# Patient Record
Sex: Female | Born: 1950 | Race: White | Hispanic: No | Marital: Married
Health system: Southern US, Community
[De-identification: ages and names within clinical notes are randomized; demographics above are authoritative.]

---

## 1998-06-03 ENCOUNTER — Other Ambulatory Visit: Admission: RE | Admit: 1998-06-03 | Discharge: 1998-06-03 | Payer: Self-pay | Admitting: Obstetrics and Gynecology

## 1998-08-11 ENCOUNTER — Other Ambulatory Visit: Admission: RE | Admit: 1998-08-11 | Discharge: 1998-08-11 | Payer: Self-pay | Admitting: Obstetrics and Gynecology

## 1998-08-11 ENCOUNTER — Encounter (INDEPENDENT_AMBULATORY_CARE_PROVIDER_SITE_OTHER): Payer: Self-pay | Admitting: *Deleted

## 1999-11-23 ENCOUNTER — Other Ambulatory Visit: Admission: RE | Admit: 1999-11-23 | Discharge: 1999-11-23 | Payer: Self-pay | Admitting: Obstetrics and Gynecology

## 2001-01-18 ENCOUNTER — Encounter: Payer: Self-pay | Admitting: Obstetrics and Gynecology

## 2001-01-18 ENCOUNTER — Ambulatory Visit (HOSPITAL_COMMUNITY): Admission: RE | Admit: 2001-01-18 | Discharge: 2001-01-18 | Payer: Self-pay | Admitting: Obstetrics and Gynecology

## 2001-03-27 ENCOUNTER — Other Ambulatory Visit: Admission: RE | Admit: 2001-03-27 | Discharge: 2001-03-27 | Payer: Self-pay | Admitting: Obstetrics and Gynecology

## 2002-01-29 ENCOUNTER — Encounter: Payer: Self-pay | Admitting: Obstetrics and Gynecology

## 2002-01-29 ENCOUNTER — Ambulatory Visit (HOSPITAL_COMMUNITY): Admission: RE | Admit: 2002-01-29 | Discharge: 2002-01-29 | Payer: Self-pay | Admitting: Obstetrics and Gynecology

## 2002-05-03 ENCOUNTER — Other Ambulatory Visit: Admission: RE | Admit: 2002-05-03 | Discharge: 2002-05-03 | Payer: Self-pay | Admitting: Obstetrics and Gynecology

## 2003-12-17 ENCOUNTER — Other Ambulatory Visit: Admission: RE | Admit: 2003-12-17 | Discharge: 2003-12-17 | Payer: Self-pay | Admitting: *Deleted

## 2004-01-30 ENCOUNTER — Ambulatory Visit (HOSPITAL_COMMUNITY): Admission: RE | Admit: 2004-01-30 | Discharge: 2004-01-30 | Payer: Self-pay | Admitting: Pulmonary Disease

## 2006-08-01 ENCOUNTER — Ambulatory Visit (HOSPITAL_COMMUNITY): Admission: RE | Admit: 2006-08-01 | Discharge: 2006-08-01 | Payer: Self-pay | Admitting: Cardiology

## 2010-02-21 ENCOUNTER — Encounter: Payer: Self-pay | Admitting: Obstetrics and Gynecology

## 2015-09-23 ENCOUNTER — Inpatient Hospital Stay (HOSPITAL_COMMUNITY): Payer: 59

## 2015-09-23 ENCOUNTER — Inpatient Hospital Stay (HOSPITAL_COMMUNITY)
Admission: AD | Admit: 2015-09-23 | Discharge: 2015-10-03 | DRG: 871 | Disposition: E | Payer: 59 | Source: Other Acute Inpatient Hospital | Attending: Pulmonary Disease | Admitting: Pulmonary Disease

## 2015-09-23 DIAGNOSIS — G4733 Obstructive sleep apnea (adult) (pediatric): Secondary | ICD-10-CM | POA: Diagnosis present

## 2015-09-23 DIAGNOSIS — N132 Hydronephrosis with renal and ureteral calculous obstruction: Secondary | ICD-10-CM

## 2015-09-23 DIAGNOSIS — Z66 Do not resuscitate: Secondary | ICD-10-CM | POA: Diagnosis not present

## 2015-09-23 DIAGNOSIS — E039 Hypothyroidism, unspecified: Secondary | ICD-10-CM | POA: Diagnosis present

## 2015-09-23 DIAGNOSIS — J9602 Acute respiratory failure with hypercapnia: Secondary | ICD-10-CM

## 2015-09-23 DIAGNOSIS — E669 Obesity, unspecified: Secondary | ICD-10-CM | POA: Diagnosis present

## 2015-09-23 DIAGNOSIS — A4151 Sepsis due to Escherichia coli [E. coli]: Principal | ICD-10-CM | POA: Diagnosis present

## 2015-09-23 DIAGNOSIS — E8770 Fluid overload, unspecified: Secondary | ICD-10-CM | POA: Diagnosis present

## 2015-09-23 DIAGNOSIS — Z6835 Body mass index (BMI) 35.0-35.9, adult: Secondary | ICD-10-CM

## 2015-09-23 DIAGNOSIS — Z87891 Personal history of nicotine dependence: Secondary | ICD-10-CM | POA: Diagnosis not present

## 2015-09-23 DIAGNOSIS — E872 Acidosis: Secondary | ICD-10-CM | POA: Diagnosis present

## 2015-09-23 DIAGNOSIS — I509 Heart failure, unspecified: Secondary | ICD-10-CM | POA: Diagnosis present

## 2015-09-23 DIAGNOSIS — K219 Gastro-esophageal reflux disease without esophagitis: Secondary | ICD-10-CM | POA: Diagnosis present

## 2015-09-23 DIAGNOSIS — J9600 Acute respiratory failure, unspecified whether with hypoxia or hypercapnia: Secondary | ICD-10-CM

## 2015-09-23 DIAGNOSIS — Z7982 Long term (current) use of aspirin: Secondary | ICD-10-CM | POA: Diagnosis not present

## 2015-09-23 DIAGNOSIS — Z87440 Personal history of urinary (tract) infections: Secondary | ICD-10-CM

## 2015-09-23 DIAGNOSIS — I4901 Ventricular fibrillation: Secondary | ICD-10-CM | POA: Diagnosis not present

## 2015-09-23 DIAGNOSIS — N179 Acute kidney failure, unspecified: Secondary | ICD-10-CM | POA: Diagnosis present

## 2015-09-23 DIAGNOSIS — E7800 Pure hypercholesterolemia, unspecified: Secondary | ICD-10-CM | POA: Diagnosis present

## 2015-09-23 DIAGNOSIS — Z7984 Long term (current) use of oral hypoglycemic drugs: Secondary | ICD-10-CM | POA: Diagnosis not present

## 2015-09-23 DIAGNOSIS — I252 Old myocardial infarction: Secondary | ICD-10-CM | POA: Diagnosis not present

## 2015-09-23 DIAGNOSIS — R0602 Shortness of breath: Secondary | ICD-10-CM

## 2015-09-23 DIAGNOSIS — Z882 Allergy status to sulfonamides status: Secondary | ICD-10-CM

## 2015-09-23 DIAGNOSIS — I469 Cardiac arrest, cause unspecified: Secondary | ICD-10-CM | POA: Diagnosis not present

## 2015-09-23 DIAGNOSIS — R579 Shock, unspecified: Secondary | ICD-10-CM | POA: Diagnosis not present

## 2015-09-23 DIAGNOSIS — I110 Hypertensive heart disease with heart failure: Secondary | ICD-10-CM | POA: Diagnosis present

## 2015-09-23 DIAGNOSIS — Z833 Family history of diabetes mellitus: Secondary | ICD-10-CM

## 2015-09-23 DIAGNOSIS — R6521 Severe sepsis with septic shock: Secondary | ICD-10-CM | POA: Diagnosis present

## 2015-09-23 DIAGNOSIS — I248 Other forms of acute ischemic heart disease: Secondary | ICD-10-CM | POA: Diagnosis not present

## 2015-09-23 DIAGNOSIS — E876 Hypokalemia: Secondary | ICD-10-CM | POA: Diagnosis present

## 2015-09-23 DIAGNOSIS — N136 Pyonephrosis: Secondary | ICD-10-CM | POA: Diagnosis present

## 2015-09-23 DIAGNOSIS — Z880 Allergy status to penicillin: Secondary | ICD-10-CM

## 2015-09-23 DIAGNOSIS — J9601 Acute respiratory failure with hypoxia: Secondary | ICD-10-CM | POA: Diagnosis not present

## 2015-09-23 DIAGNOSIS — E1165 Type 2 diabetes mellitus with hyperglycemia: Secondary | ICD-10-CM | POA: Diagnosis present

## 2015-09-23 DIAGNOSIS — A419 Sepsis, unspecified organism: Secondary | ICD-10-CM | POA: Diagnosis present

## 2015-09-23 LAB — GLUCOSE, CAPILLARY: GLUCOSE-CAPILLARY: 242 mg/dL — AB (ref 65–99)

## 2015-09-23 MED ORDER — FAMOTIDINE IN NACL 20-0.9 MG/50ML-% IV SOLN
20.0000 mg | Freq: Once | INTRAVENOUS | Status: AC
  Administered 2015-09-23: 20 mg via INTRAVENOUS
  Filled 2015-09-23: qty 50

## 2015-09-23 MED ORDER — PHENYLEPHRINE HCL 10 MG/ML IJ SOLN
25.0000 ug/min | INTRAMUSCULAR | Status: DC
  Administered 2015-09-23: 30 ug/min via INTRAVENOUS
  Administered 2015-09-24: 70 ug/min via INTRAVENOUS
  Administered 2015-09-24: 65 ug/min via INTRAVENOUS
  Administered 2015-09-24: 160 ug/min via INTRAVENOUS
  Administered 2015-09-24 (×2): 150 ug/min via INTRAVENOUS
  Administered 2015-09-24: 60 ug/min via INTRAVENOUS
  Administered 2015-09-24: 50 ug/min via INTRAVENOUS
  Administered 2015-09-24: 200 ug/min via INTRAVENOUS
  Filled 2015-09-23 (×10): qty 1

## 2015-09-23 MED ORDER — HEPARIN SODIUM (PORCINE) 5000 UNIT/ML IJ SOLN
5000.0000 [IU] | Freq: Three times a day (TID) | INTRAMUSCULAR | Status: DC
  Administered 2015-09-23 – 2015-09-24 (×2): 5000 [IU] via SUBCUTANEOUS
  Filled 2015-09-23 (×2): qty 1

## 2015-09-23 MED ORDER — SODIUM CHLORIDE 0.9 % IV SOLN
INTRAVENOUS | Status: DC
  Administered 2015-09-23 – 2015-09-24 (×2): via INTRAVENOUS

## 2015-09-23 MED ORDER — ASPIRIN 300 MG RE SUPP: 300.0000 mg | RECTAL | Status: DC

## 2015-09-23 MED ORDER — SODIUM CHLORIDE 0.9 % IV SOLN: 250.0000 mL | INTRAVENOUS | Status: DC | PRN

## 2015-09-23 MED ORDER — ASPIRIN 81 MG PO CHEW: 324.0000 mg | CHEWABLE_TABLET | ORAL | Status: DC

## 2015-09-23 NOTE — Progress Notes (Signed)
Pharmacy Antibiotic Note  Carrie Townsend is a 65 y.o. female admitted on 07/01/15 with pyelonephritis/urosepsis.  Pharmacy has been consulted for Levaquin and Gentamicin dosing.  Received Levaquin 750 and Gentamicin 80 mg at [facility] at ~3 pm  Plan: Levaquin 500 mg IV q48h F/U renal function this morning and redose if necessary  Height: 5\' 4"  (162.6 cm) Weight: 203 lb 11.3 oz (92.4 kg) IBW/kg (Calculated) : 54.7  No data recorded.  Labs: ( at [facility]) WBC  25.5 Hgb  11.1 Hct  33.1 Plt  296  SCr  2.23   No results for input(s): WBC, CREATININE, LATICACIDVEN, VANCOTROUGH, VANCOPEAK, VANCORANDOM, GENTTROUGH, GENTPEAK, GENTRANDOM, TOBRATROUGH, TOBRAPEAK, TOBRARND, AMIKACINPEAK, AMIKACINTROU, AMIKACIN in the last 168 hours.  CrCl cannot be calculated (No order found.).    Allergies  Allergen Reactions  . Penicillins Other (See Comments)    Unknown, Childhood.   Carrie Townsend. Sulfa Antibiotics Rash    Carrie Townsend, Carrie Townsend 07/01/15 11:44 PM

## 2015-09-23 NOTE — H&P (Signed)
PULMONARY / CRITICAL CARE MEDICINE   Name: Carrie Townsend MRN: [id number] DOB: January 10, 1951    ADMISSION DATE:  09-30-2015 CONSULTATION DATE:  09-30-2015  REFERRING MD:  [facility] Dr. Nechama GuardBauer  CHIEF COMPLAINT:  Shock/Pyelonephritis  HISTORY OF PRESENT ILLNESS:   65 year old female with PMH as below, which is significant for DM, HTN, hypothyroidism, and GERD. She was [facility] with diagnosis of UTI/pyelonephritis. KUB and Ultrasound demonstrate obstructing stone in R renal pelvis. She was hypotensive with lactic acid initially of 8 requiring 4L IVF resuscitation and subsequently vasoactive infusions.  [facility] was [facility] for further evaluation and urology consultation.   Patient has had recurrent UTIs since she was 65 years old. Her current urologist is Dr[facility] Ne[facility]. She's not sure why she has recurrent UTI but allegedly, workup has been unremarkable until this admission. Historically, she would get a UTI every month and will be placed on antibiotics. At certain times, she would be on an abx  for a year and that would control the UTI. Since December 2016, she has had UTI almost monthly. Prior to that, she was UTI free for 7 years at least. Since January of this year, she has been on antibiotics almost monthly. She has taken the following: Ciprofloxacin, levofloxacin, Bactroban. Over the weekend, she started having some fevers, chills, nausea, vomiting, abdominal pain, and right flank pain. She was admitted [facility] and was diagnosed to have a stone in her right kidney. She was then subsequently transferred here.    PAST MEDICAL HISTORY :  She  has no past medical history on file.  Her current UTI. Allegedly workup has been negative in the past. Hypertension, HTN, DM, hypothyroidism, GERD.  Denies CAD.  (-) History of cancer or blood clot. No history of asthma or COPD.   PAST SURGICAL HISTORY: She  has no past  surgical history on file.  (-) surgical history elicited.    Allergies not on file  She gets generalized rash and difficult to breathe when exposed to penicillin and sulfa drugs.   No current facility-administered medications on file prior to encounter.    No current outpatient prescriptions on file prior to encounter.    FAMILY HISTORY:  Her has no family status information on file.  Father is 214 years old. He has diabetes and HTN. Mother is deceased. She had heart dse.   SOCIAL HISTORY: She  is married. She currently works and does office work. She works for [employer]. She smoked in her 4720s for roughly 7 years, a pack would last maybe a week. Denies drinking alcohol.   REVIEW OF SYSTEMS:   Generalized weakness, fevers, chills, nausea, vomiting, anorexia, dyspnea, abdominal pain, right flank pain. She presented with all these symptoms and they're all somewhat better compared to this morning. 14 point review systems was in and everything else was negative.  SUBJECTIVE:   as mentioned.   VITAL SIGNS: There were no vitals taken for this visit.  HEMODYNAMICS: PAP: ()/()   VENTILATOR SETTINGS:    INTAKE / OUTPUT: No intake/output data recorded.  PHYSICAL EXAMINATION: General:  Awake, comfortable, not in distress. Neuro:  Cranial nerves grossly intact. No lateralizing signs elicited. HEENT:  PERLA, (-) NVD. Mallampati 3-4. (-) NVD. (-) oral thrush.  Cardiovascular:  Good S1 and S2, no S3 murmur rub or gallop. Lungs:  Good air entry, some crackles at the bases. No wheezing or rhonchi. No accessory muscle use.  Abdomen:  (+) BS, soft, obese, direct tenderness over at the R flank area/RUQ area. (-) rebound tenderness.  Musculoskeletal:  groslly normal. Good tone/bulk Skin:  Warm and dry. (-) edema/clubbing/cyanosis. (-) rash.   LABS:  BMET No results for input(s): NA, K, CL, CO2, BUN, CREATININE, GLUCOSE in the last 168 hours.  Electrolytes No results for  input(s): CALCIUM, MG, PHOS in the last 168 hours.  CBC No results for input(s): WBC, HGB, HCT, PLT in the last 168 hours.  Coag's No results for input(s): APTT, INR in the last 168 hours.  Sepsis Markers No results for input(s): LATICACIDVEN, PROCALCITON, O2SATVEN in the last 168 hours.  ABG No results for input(s): PHART, PCO2ART, PO2ART in the last 168 hours.  Liver Enzymes No results for input(s): AST, ALT, ALKPHOS, BILITOT, ALBUMIN in the last 168 hours.  Cardiac Enzymes No results for input(s): TROPONINI, PROBNP in the last 168 hours.  Glucose No results for input(s): GLUCAP in the last 168 hours.  Imaging No results found.   STUDIES:  KUB > 8mm calculus in R renal pelvis.  Abdominal US 8/22 > Obstructing 1 cm right UPJ calculus, gallbladder adenomyomatosis.   CULTURES: Blood 8/22> Urine 8/22 > Cultures from [facility] 8/22 >  ANTIBIOTICS: Levaquin 8/22 > Gentamycin 8/22 >  SIGNIFICANT EVENTS: 8/22 admit to [facility] pyelo > transfer [facility] septic shock  LINES/TUBES:   ASSESSMENT / PLAN:  PULMONARY A: At risk for pulmonary congestion/volume overload RVD 2/2 Obesity Likely with untreated OSA (with snoring, witnessed apneas, obese, crowded airway) P:   Keep O2 saturation more than 88%. Watch out for congestion. CXR now (baseline) We'll need a sleep study as an outpatient. May Need ABG if with note of SOB/dyspnea/hypersomnia  CARDIOVASCULAR A:  Septic shock, 2/2 UTI.  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 She is on Neo-Synephrine at 120 mcg/min and her MAP is > 65 mmHg. Try to wean off Neo-Synephrine. Check troponins. Check 2-D echo.  RENAL A:   AKI/Acute renal failure - pre-renal due to sepsis/shock.  Possible intrarenal as well secondary to obstructive uropathy with R UPJ calculus AG acidosis - lactic  P:   Cont IVF (maintenance). Has received 4.5 L since being  admitted this morning.  Check lytes.  Will need to call urology in am. Likely will need a stent/nephrostomy  GASTROINTESTINAL A:   GERD P:   NPO for now.  PPI.  Check amylase, lipase, LFTs  HEMATOLOGIC A:   No issues P:  Will observe.   INFECTIOUS A:   Severe Sepsis secondary to pyelonephritis / UTI 2/2 R UPJ calculus Recurrent UTI  P:   ABX as above Levophed has been weaned off. Wean off Neosynpehrine. Keep MAP > 65 mm Hg Follow cultures Obtain culture data from [facility]. > This year, patient has been on Ciprofloxacin in January, Levaquin in May and June, and [place] in July. We need to get cultures from Urologist office so we will be guided accordingly. Pt with PCN and sulfa allergy. She has not received aminoglycosides before. Not sure if she has received cephalosporins in the past. PCT Check lactate  ENDOCRINE A:   DM Hypothyroidism    P:   CBG monitoring and SSI Start home meds of syntroid.   NEUROLOGIC A:   Pain 2/2 Kidney stone P:   RASS goal: 0 Fentanyl prn   FAMILY  - Updates: Husband was updated at  bedside.   - Inter-disciplinary family meet or Palliative Care meeting due by:  09/30/15   Critical care time with this patient today : 30 minutes.   Pollie MeyerJ. Angelo A de Dios, MD 2015-04-21, 11:42 PM [facility] Pager [phone] After 3 pm or if no answer, call [phone]

## 2015-09-24 ENCOUNTER — Inpatient Hospital Stay (HOSPITAL_COMMUNITY): Payer: 59

## 2015-09-24 ENCOUNTER — Other Ambulatory Visit: Payer: Self-pay

## 2015-09-24 ENCOUNTER — Other Ambulatory Visit (HOSPITAL_COMMUNITY): Payer: 59

## 2015-09-24 ENCOUNTER — Encounter (HOSPITAL_COMMUNITY): Payer: Self-pay | Admitting: Interventional Radiology

## 2015-09-24 DIAGNOSIS — A419 Sepsis, unspecified organism: Secondary | ICD-10-CM

## 2015-09-24 DIAGNOSIS — R579 Shock, unspecified: Secondary | ICD-10-CM

## 2015-09-24 DIAGNOSIS — J9601 Acute respiratory failure with hypoxia: Secondary | ICD-10-CM

## 2015-09-24 DIAGNOSIS — N132 Hydronephrosis with renal and ureteral calculous obstruction: Secondary | ICD-10-CM

## 2015-09-24 DIAGNOSIS — R6521 Severe sepsis with septic shock: Secondary | ICD-10-CM

## 2015-09-24 DIAGNOSIS — J9600 Acute respiratory failure, unspecified whether with hypoxia or hypercapnia: Secondary | ICD-10-CM

## 2015-09-24 HISTORY — PX: IR GENERIC HISTORICAL: IMG1180011

## 2015-09-24 LAB — PROTIME-INR
INR: 1.35
INR: 1.41
INR: 1.62
PROTHROMBIN TIME: 16.8 s — AB (ref 11.4–15.2)
Prothrombin Time: 17.4 seconds — ABNORMAL HIGH (ref 11.4–15.2)
Prothrombin Time: 19.4 seconds — ABNORMAL HIGH (ref 11.4–15.2)

## 2015-09-24 LAB — BASIC METABOLIC PANEL
Anion gap: 14 (ref 5–15)
BUN: 33 mg/dL — AB (ref 6–20)
CALCIUM: 8 mg/dL — AB (ref 8.9–10.3)
CHLORIDE: 103 mmol/L (ref 101–111)
CO2: 17 mmol/L — ABNORMAL LOW (ref 22–32)
CREATININE: 2.58 mg/dL — AB (ref 0.44–1.00)
GFR calc Af Amer: 21 mL/min — ABNORMAL LOW (ref >60)
GFR, EST NON AFRICAN AMERICAN: 19 mL/min — AB (ref >60)
Glucose, Bld: 168 mg/dL — ABNORMAL HIGH (ref 65–99)
Potassium: 3.7 mmol/L (ref 3.5–5.1)
SODIUM: 134 mmol/L — AB (ref 135–145)

## 2015-09-24 LAB — POCT I-STAT 3, ART BLOOD GAS (G3+)
ACID-BASE DEFICIT: 19 mmol/L — AB (ref 0.0–2.0)
Acid-base deficit: 14 mmol/L — ABNORMAL HIGH (ref 0.0–2.0)
Acid-base deficit: 19 mmol/L — ABNORMAL HIGH (ref 0.0–2.0)
BICARBONATE: 12 meq/L — AB (ref 20.0–24.0)
BICARBONATE: 12.5 meq/L — AB (ref 20.0–24.0)
BICARBONATE: 14.7 meq/L — AB (ref 20.0–24.0)
O2 SAT: 89 %
O2 Saturation: 98 %
O2 Saturation: 97 %
PCO2 ART: 53.3 mmHg — AB (ref 35.0–45.0)
PCO2 ART: 49.5 mmHg — AB (ref 35.0–45.0)
PCO2 ART: 57.4 mmHg — AB (ref 35.0–45.0)
PH ART: 6.972 — AB (ref 7.350–7.450)
PH ART: 7.089 — AB (ref 7.350–7.450)
PO2 ART: 182 mmHg — AB (ref 80.0–100.0)
PO2 ART: 128 mmHg — AB (ref 80.0–100.0)
PO2 ART: 97 mmHg (ref 80.0–100.0)
Patient temperature: 101.3
Patient temperature: 101.3
Patient temperature: 101.3
TCO2: 14 mmol/L (ref 0–100)
TCO2: 14 mmol/L (ref 0–100)
TCO2: 16 mmol/L (ref 0–100)
pH, Arterial: 6.957 — CL (ref 7.350–7.450)

## 2015-09-24 LAB — CBC
HCT: 34 % — ABNORMAL LOW (ref 36.0–46.0)
Hemoglobin: 10.9 g/dL — ABNORMAL LOW (ref 12.0–15.0)
MCH: 28.8 pg (ref 26.0–34.0)
MCHC: 32.1 g/dL (ref 30.0–36.0)
MCV: 89.9 fL (ref 78.0–100.0)
PLATELETS: 254 10*3/uL (ref 150–400)
RBC: 3.78 MIL/uL — ABNORMAL LOW (ref 3.87–5.11)
RDW: 14.8 % (ref 11.5–15.5)
WBC: 14.4 10*3/uL — ABNORMAL HIGH (ref 4.0–10.5)

## 2015-09-24 LAB — COMPREHENSIVE METABOLIC PANEL
ALBUMIN: 2.6 g/dL — AB (ref 3.5–5.0)
ALK PHOS: 60 U/L (ref 38–126)
ALT: 15 U/L (ref 14–54)
ALT: 15 U/L (ref 14–54)
ANION GAP: 11 (ref 5–15)
AST: 26 U/L (ref 15–41)
AST: 27 U/L (ref 15–41)
Albumin: 2.1 g/dL — ABNORMAL LOW (ref 3.5–5.0)
Alkaline Phosphatase: 300 U/L — ABNORMAL HIGH (ref 38–126)
Anion gap: 15 (ref 5–15)
BUN: 29 mg/dL — ABNORMAL HIGH (ref 6–20)
BUN: 33 mg/dL — ABNORMAL HIGH (ref 6–20)
CALCIUM: 7.8 mg/dL — AB (ref 8.9–10.3)
CHLORIDE: 106 mmol/L (ref 101–111)
CHLORIDE: 103 mmol/L (ref 101–111)
CO2: 16 mmol/L — AB (ref 22–32)
CO2: 14 mmol/L — AB (ref 22–32)
CREATININE: 2.67 mg/dL — AB (ref 0.44–1.00)
Calcium: 7.4 mg/dL — ABNORMAL LOW (ref 8.9–10.3)
Creatinine, Ser: 2.76 mg/dL — ABNORMAL HIGH (ref 0.44–1.00)
GFR calc non Af Amer: 17 mL/min — ABNORMAL LOW (ref >60)
GFR calc non Af Amer: 18 mL/min — ABNORMAL LOW (ref >60)
GFR, EST AFRICAN AMERICAN: 20 mL/min — AB (ref >60)
GFR, EST AFRICAN AMERICAN: 21 mL/min — AB (ref >60)
GLUCOSE: 234 mg/dL — AB (ref 65–99)
Glucose, Bld: 189 mg/dL — ABNORMAL HIGH (ref 65–99)
POTASSIUM: 3.2 mmol/L — AB (ref 3.5–5.1)
Potassium: 3.7 mmol/L (ref 3.5–5.1)
SODIUM: 133 mmol/L — AB (ref 135–145)
SODIUM: 132 mmol/L — AB (ref 135–145)
Total Bilirubin: 0.9 mg/dL (ref 0.3–1.2)
Total Bilirubin: 1.3 mg/dL — ABNORMAL HIGH (ref 0.3–1.2)
Total Protein: 5.5 g/dL — ABNORMAL LOW (ref 6.5–8.1)
Total Protein: 5.8 g/dL — ABNORMAL LOW (ref 6.5–8.1)

## 2015-09-24 LAB — GLUCOSE, CAPILLARY
GLUCOSE-CAPILLARY: 191 mg/dL — AB (ref 65–99)
GLUCOSE-CAPILLARY: 203 mg/dL — AB (ref 65–99)
Glucose-Capillary: 184 mg/dL — ABNORMAL HIGH (ref 65–99)
Glucose-Capillary: 168 mg/dL — ABNORMAL HIGH (ref 65–99)
Glucose-Capillary: 163 mg/dL — ABNORMAL HIGH (ref 65–99)
Glucose-Capillary: 129 mg/dL — ABNORMAL HIGH (ref 65–99)

## 2015-09-24 LAB — CBC WITH DIFFERENTIAL/PLATELET
BASOS PCT: 0 %
BASOS PCT: 0 %
Basophils Absolute: 0 10*3/uL (ref 0.0–0.1)
Basophils Absolute: 0 10*3/uL (ref 0.0–0.1)
EOS ABS: 0 10*3/uL (ref 0.0–0.7)
EOS ABS: 0 10*3/uL (ref 0.0–0.7)
EOS PCT: 1 %
Eosinophils Relative: 0 %
HCT: 35.4 % — ABNORMAL LOW (ref 36.0–46.0)
HCT: 36.2 % (ref 36.0–46.0)
HEMOGLOBIN: 11.3 g/dL — AB (ref 12.0–15.0)
Hemoglobin: 11.5 g/dL — ABNORMAL LOW (ref 12.0–15.0)
LYMPHS PCT: 16 %
LYMPHS PCT: 4 %
Lymphs Abs: 0.7 10*3/uL (ref 0.7–4.0)
Lymphs Abs: 0.9 10*3/uL (ref 0.7–4.0)
MCH: 28.9 pg (ref 26.0–34.0)
MCH: 29 pg (ref 26.0–34.0)
MCHC: 31.8 g/dL (ref 30.0–36.0)
MCHC: 31.9 g/dL (ref 30.0–36.0)
MCV: 90.5 fL (ref 78.0–100.0)
MCV: 91.2 fL (ref 78.0–100.0)
MONOS PCT: 2 %
Monocytes Absolute: 0.1 10*3/uL (ref 0.1–1.0)
Monocytes Absolute: 0.9 10*3/uL (ref 0.1–1.0)
Monocytes Relative: 4 %
NEUTROS ABS: 20.2 10*3/uL — AB (ref 1.7–7.7)
NEUTROS PCT: 81 %
Neutro Abs: 3.3 10*3/uL (ref 1.7–7.7)
Neutrophils Relative %: 92 %
PLATELETS: 202 10*3/uL (ref 150–400)
Platelets: 288 10*3/uL (ref 150–400)
RBC: 3.91 MIL/uL (ref 3.87–5.11)
RBC: 3.97 MIL/uL (ref 3.87–5.11)
RDW: 14.8 % (ref 11.5–15.5)
RDW: 15 % (ref 11.5–15.5)
WBC MORPHOLOGY: INCREASED
WBC: 22 10*3/uL — ABNORMAL HIGH (ref 4.0–10.5)
WBC: 4.1 10*3/uL (ref 4.0–10.5)

## 2015-09-24 LAB — LACTIC ACID, PLASMA
LACTIC ACID, VENOUS: 4.2 mmol/L — AB (ref 0.5–1.9)
LACTIC ACID, VENOUS: 5.9 mmol/L — AB (ref 0.5–1.9)
Lactic Acid, Venous: 7.3 mmol/L (ref 0.5–1.9)

## 2015-09-24 LAB — MAGNESIUM
MAGNESIUM: 2.7 mg/dL — AB (ref 1.7–2.4)
Magnesium: 1.1 mg/dL — ABNORMAL LOW (ref 1.7–2.4)

## 2015-09-24 LAB — CORTISOL: Cortisol, Plasma: 27.4 ug/dL

## 2015-09-24 LAB — TROPONIN I
TROPONIN I: 1.83 ng/mL — AB (ref <0.03)
TROPONIN I: 2.03 ng/mL — AB (ref <0.03)
TROPONIN I: 2.15 ng/mL — AB (ref <0.03)
Troponin I: 1.78 ng/mL (ref <0.03)

## 2015-09-24 LAB — LIPASE, BLOOD: Lipase: 20 U/L (ref 11–51)

## 2015-09-24 LAB — APTT: APTT: 47 s — AB (ref 24–36)

## 2015-09-24 LAB — FIBRINOGEN: FIBRINOGEN: 788 mg/dL — AB (ref 210–475)

## 2015-09-24 LAB — PHOSPHORUS
PHOSPHORUS: 3.4 mg/dL (ref 2.5–4.6)
Phosphorus: 3.8 mg/dL (ref 2.5–4.6)

## 2015-09-24 LAB — PROCALCITONIN: PROCALCITONIN: 48.41 ng/mL

## 2015-09-24 LAB — AMYLASE: AMYLASE: 27 U/L — AB (ref 28–100)

## 2015-09-24 LAB — MRSA PCR SCREENING: MRSA by PCR: NEGATIVE

## 2015-09-24 MED ORDER — MIDAZOLAM HCL 2 MG/2ML IJ SOLN
INTRAMUSCULAR | Status: AC
  Filled 2015-09-24: qty 4

## 2015-09-24 MED ORDER — PANTOPRAZOLE SODIUM 40 MG PO PACK
40.0000 mg | PACK | Freq: Every day | ORAL | Status: DC
  Administered 2015-09-24: 40 mg
  Filled 2015-09-24: qty 20

## 2015-09-24 MED ORDER — MIDAZOLAM HCL 2 MG/2ML IJ SOLN
INTRAMUSCULAR | Status: AC | PRN
  Administered 2015-09-24 (×3): 1 mg via INTRAVENOUS

## 2015-09-24 MED ORDER — FENTANYL CITRATE (PF) 100 MCG/2ML IJ SOLN
INTRAMUSCULAR | Status: AC | PRN
  Administered 2015-09-24: 25 ug via INTRAVENOUS
  Administered 2015-09-24: 50 ug via INTRAVENOUS
  Administered 2015-09-24: 25 ug via INTRAVENOUS
  Administered 2015-09-24: 50 ug via INTRAVENOUS

## 2015-09-24 MED ORDER — INSULIN ASPART 100 UNIT/ML ~~LOC~~ SOLN
0.0000 [IU] | SUBCUTANEOUS | Status: DC
  Administered 2015-09-24: 5 [IU] via SUBCUTANEOUS
  Administered 2015-09-24 (×4): 3 [IU] via SUBCUTANEOUS

## 2015-09-24 MED ORDER — SODIUM CHLORIDE 0.9% FLUSH: 10.0000 mL | INTRAVENOUS | Status: DC | PRN

## 2015-09-24 MED ORDER — SODIUM BICARBONATE 8.4 % IV SOLN: 100.0000 meq | Freq: Once | INTRAVENOUS | Status: DC

## 2015-09-24 MED ORDER — VANCOMYCIN HCL 10 G IV SOLR
1750.0000 mg | Freq: Once | INTRAVENOUS | Status: AC
  Administered 2015-09-24: 1750 mg via INTRAVENOUS
  Filled 2015-09-24: qty 1750

## 2015-09-24 MED ORDER — LIDOCAINE HCL 1 % IJ SOLN
INTRAMUSCULAR | Status: AC
  Filled 2015-09-24: qty 20

## 2015-09-24 MED ORDER — SODIUM CHLORIDE 0.9 % IV SOLN
500.0000 mg | Freq: Two times a day (BID) | INTRAVENOUS | Status: DC
  Administered 2015-09-24 (×2): 500 mg via INTRAVENOUS
  Filled 2015-09-24 (×3): qty 500

## 2015-09-24 MED ORDER — PHENYLEPHRINE HCL 10 MG/ML IJ SOLN
25.0000 ug/min | INTRAVENOUS | Status: DC
  Administered 2015-09-24: 160 ug/min via INTRAVENOUS
  Filled 2015-09-24 (×2): qty 4

## 2015-09-24 MED ORDER — MAGNESIUM SULFATE 4 GM/100ML IV SOLN
4.0000 g | Freq: Once | INTRAVENOUS | Status: AC
  Administered 2015-09-24: 4 g via INTRAVENOUS
  Filled 2015-09-24: qty 100

## 2015-09-24 MED ORDER — FENTANYL CITRATE (PF) 100 MCG/2ML IJ SOLN: 25.0000 ug | INTRAMUSCULAR | Status: DC | PRN

## 2015-09-24 MED ORDER — ACETAMINOPHEN 325 MG PO TABS
650.0000 mg | ORAL_TABLET | Freq: Four times a day (QID) | ORAL | Status: DC | PRN
  Administered 2015-09-24: 650 mg via ORAL
  Filled 2015-09-24: qty 2

## 2015-09-24 MED ORDER — HEPARIN SODIUM (PORCINE) 5000 UNIT/ML IJ SOLN
5000.0000 [IU] | Freq: Three times a day (TID) | INTRAMUSCULAR | Status: DC
  Filled 2015-09-24: qty 1

## 2015-09-24 MED ORDER — NOREPINEPHRINE BITARTRATE 1 MG/ML IV SOLN
0.0000 ug/min | INTRAVENOUS | Status: DC
  Administered 2015-09-24: 10 ug/min via INTRAVENOUS

## 2015-09-24 MED ORDER — SODIUM BICARBONATE 8.4 % IV SOLN
100.0000 meq | Freq: Once | INTRAVENOUS | Status: AC
  Administered 2015-09-24: 100 meq via INTRAVENOUS
  Filled 2015-09-24: qty 50

## 2015-09-24 MED ORDER — SODIUM BICARBONATE 8.4 % IV SOLN
INTRAVENOUS | Status: AC
Start: 2015-09-24 — End: 2015-09-24
  Filled 2015-09-24: qty 50

## 2015-09-24 MED ORDER — FENTANYL CITRATE (PF) 100 MCG/2ML IJ SOLN
INTRAMUSCULAR | Status: AC
  Filled 2015-09-24: qty 4

## 2015-09-24 MED ORDER — LEVOFLOXACIN IN D5W 500 MG/100ML IV SOLN: 500.0000 mg | INTRAVENOUS | Status: DC

## 2015-09-24 MED ORDER — ETOMIDATE 2 MG/ML IV SOLN
20.0000 mg | Freq: Once | INTRAVENOUS | Status: AC
  Administered 2015-09-24: 20 mg via INTRAVENOUS

## 2015-09-24 MED ORDER — FENTANYL CITRATE (PF) 100 MCG/2ML IJ SOLN
100.0000 ug | Freq: Once | INTRAMUSCULAR | Status: AC
  Administered 2015-09-24: 100 ug via INTRAVENOUS

## 2015-09-24 MED ORDER — VANCOMYCIN HCL IN DEXTROSE 1-5 GM/200ML-% IV SOLN: 1000.0000 mg | INTRAVENOUS | Status: DC

## 2015-09-24 MED ORDER — SODIUM BICARBONATE 8.4 % IV SOLN
INTRAVENOUS | Status: AC
  Filled 2015-09-24: qty 200

## 2015-09-24 MED ORDER — ROCURONIUM BROMIDE 50 MG/5ML IV SOLN
50.0000 mg | Freq: Once | INTRAVENOUS | Status: AC
  Administered 2015-09-24: 50 mg via INTRAVENOUS

## 2015-09-24 MED ORDER — FENTANYL CITRATE (PF) 100 MCG/2ML IJ SOLN
25.0000 ug | INTRAMUSCULAR | Status: DC | PRN
  Administered 2015-09-24: 100 ug via INTRAVENOUS
  Filled 2015-09-24 (×3): qty 2

## 2015-09-24 MED ORDER — MIDAZOLAM HCL 2 MG/2ML IJ SOLN
INTRAMUSCULAR | Status: AC
  Administered 2015-09-24: 2 mg
  Filled 2015-09-24: qty 2

## 2015-09-24 MED ORDER — ONDANSETRON HCL 4 MG/2ML IJ SOLN
4.0000 mg | INTRAMUSCULAR | Status: AC
  Administered 2015-09-24: 4 mg via INTRAVENOUS

## 2015-09-24 MED ORDER — STERILE WATER FOR INJECTION IV SOLN
INTRAVENOUS | Status: DC
  Administered 2015-09-24: 22:00:00 via INTRAVENOUS
  Filled 2015-09-24 (×4): qty 850

## 2015-09-24 MED ORDER — HYDROCORTISONE NA SUCCINATE PF 100 MG IJ SOLR: 50.0000 mg | Freq: Four times a day (QID) | INTRAMUSCULAR | Status: DC

## 2015-09-24 MED ORDER — MIDAZOLAM HCL 2 MG/2ML IJ SOLN: 2.0000 mg | Freq: Once | INTRAMUSCULAR | Status: AC

## 2015-09-24 MED ORDER — IOPAMIDOL (ISOVUE-300) INJECTION 61%
INTRAVENOUS | Status: AC
  Administered 2015-09-24: 20 mL
  Filled 2015-09-24: qty 50

## 2015-09-24 MED ORDER — POTASSIUM CHLORIDE 10 MEQ/50ML IV SOLN
10.0000 meq | INTRAVENOUS | Status: AC
  Administered 2015-09-24 (×2): 10 meq via INTRAVENOUS
  Filled 2015-09-24: qty 50

## 2015-09-24 MED ORDER — SODIUM CHLORIDE 0.9% FLUSH: 10.0000 mL | Freq: Two times a day (BID) | INTRAVENOUS | Status: DC

## 2015-09-24 MED ORDER — ASPIRIN 325 MG PO TABS
325.0000 mg | ORAL_TABLET | Freq: Every day | ORAL | Status: DC
  Administered 2015-09-24: 325 mg via ORAL
  Filled 2015-09-24: qty 1

## 2015-09-24 MED ORDER — MIDAZOLAM HCL 2 MG/2ML IJ SOLN: 1.0000 mg | INTRAMUSCULAR | Status: DC | PRN

## 2015-09-25 MED FILL — Medication: Qty: 1 | Status: AC

## 2015-09-25 NOTE — Procedures (Signed)
Extubation Procedure Note  Patient Details:   Name: Carrie Townsend DOB: [name] MRN: [id number]   Airway Documentation:     Evaluation  O2 sats: unreadable, pt has deceased Complications: No apparent complications Patient did tolerate procedure well. Bilateral Breath Sounds: Clear, Diminished   No  Pt has been taking off the vent, pt has deceased. Pt was extubated per family request immediately after death. RN aware.   Benjamine SpragueMaurice L Castin Donaghue, BS, RRT, RCP 09/25/2015, 12:02 AM

## 2015-09-26 LAB — TYPE AND SCREEN
ABO/RH(D): A NEG
Antibody Screen: POSITIVE
DAT, IGG: NEGATIVE
UNIT DIVISION: 0
UNIT DIVISION: 0

## 2015-09-26 LAB — URINE CULTURE: Culture: >=100000 — AB

## 2015-09-27 LAB — URINE CULTURE

## 2015-09-28 NOTE — Discharge Summary (Signed)
PULMONARY / CRITICAL CARE MEDICINE   Name: JAMIE HAFFORD MRN: [id number] DOB: [birth year]    ADMISSION DATE:  09/09/2015 CONSULTATION DATE:  09/30/2015  REFERRING MD:  Maryruth Bun Dr. Nechama Guard  CHIEF COMPLAINT:  Shock/Pyelonephritis  HISTORY OF PRESENT ILLNESS:   65 year old female with PMH as below, which is significant for DM, HTN, hypothyroidism, and GERD. She was admitted to [facility] with diagnosis of UTI/pyelonephritis. KUB and Ultrasound demonstrate obstructing stone in R renal pelvis. She was hypotensive with lactic acid initially of 8 requiring 4L IVF resuscitation and subsequently vasoactive infusions.  She was transferred to [facility] for further evaluation and urology consultation.   Patient has had recurrent UTIs since she was 65 years old. Her current urologist is Dr. Nechama Guard over at [facility]. She's not sure why she has recurrent UTI but allegedly, workup has been unremarkable until this admission. Historically, she would get a UTI every month and will be placed on antibiotics. At certain times, she would be on an abx  for a year and that would control the UTI. Since December 2016, she has had UTI almost monthly. Prior to that, she was UTI free for 7 years at least. Since January of this year, she has been on antibiotics almost monthly. She has taken the following: Ciprofloxacin, levofloxacin, Bactroban. Over the weekend, she started having some fevers, chills, nausea, vomiting, abdominal pain, and right flank pain. She was admitted at [facility] and was diagnosed to have a stone in her right kidney. She was then subsequently transferred here.    PAST MEDICAL HISTORY :  She  has no past medical history on file.  Her current UTI. Allegedly workup has been negative in the past. Hypertension, HTN, DM, hypothyroidism, GERD.  Denies CAD.  (-) History of cancer or blood clot. No history of asthma or COPD.   PAST SURGICAL HISTORY: She  has a past surgical  history that includes ir generic historical (09/29/15).  (-) surgical history elicited.    Allergies  Allergen Reactions  . Penicillins Other (See Comments)    Has patient had a PCN reaction causing immediate rash, facial/tongue/throat swelling, SOB or lightheadedness with hypotension: No Has patient had a PCN reaction causing severe rash involving mucus membranes or skin necrosis: No Has patient had a PCN reaction that required hospitalization No Has patient had a PCN reaction occurring within the last 10 years: No If all of the above answers are "NO", then may proceed with Cephalosporin use.  Unknown, Childhood. Pt thinks that it may have been a rash.   . Sulfa Antibiotics Rash    She gets generalized rash and difficult to breathe when exposed to penicillin and sulfa drugs.   No current facility-administered medications on file prior to encounter.    No current outpatient prescriptions on file prior to encounter.    FAMILY HISTORY:  Her has no family status information on file.  Father is 7 years old. He has diabetes and HTN. Mother is deceased. She had heart dse.   SOCIAL HISTORY: She  is married. 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 She smoked in her 22s for roughly 7 years, a pack would last maybe a week. Denies drinking alcohol.   REVIEW OF SYSTEMS:   Generalized weakness, fevers, chills, nausea, vomiting, anorexia, dyspnea, abdominal pain, right flank pain. She presented with all these symptoms and they're all somewhat better compared to this morning. 14 point review systems was in and  everything else was negative.  SUBJECTIVE:   as mentioned.   VITAL SIGNS: BP (!) 55/31   Pulse (!) 117   Temp (!) 101.3 F (38.5 C) (Oral)   Resp (!) 30   Ht 5\' 4"  (1.626 m)   Wt 204 lb 2.3 oz (92.6 kg)   SpO2 98%   BMI 35.04 kg/m   HEMODYNAMICS:    VENTILATOR SETTINGS:    INTAKE / OUTPUT: I/O last 3 completed shifts: In: 1481.9  [I.V.:901.9; NG/GT:30; IV Piggyback:550] Out: 600 [Urine:600]  PHYSICAL EXAMINATION: General:  Awake, comfortable, not in distress. Neuro:  Cranial nerves grossly intact. No lateralizing signs elicited. HEENT:  PERLA, (-) NVD. Mallampati 3-4. (-) NVD. (-) oral thrush.  Cardiovascular:  Good S1 and S2, no S3 murmur rub or gallop. Lungs:  Good air entry, some crackles at the bases. No wheezing or rhonchi. No accessory muscle use. Abdomen:  (+) BS, soft, obese, direct tenderness over at the R flank area/RUQ area. (-) rebound tenderness.  Musculoskeletal:  groslly normal. Good tone/bulk Skin:  Warm and dry. (-) edema/clubbing/cyanosis. (-) rash.   LABS:  BMET  Recent Labs Lab September 30, 2015 2355 10/02/2015 0521 09/22/2015 1702  NA 132* 134* 133*  K 3.7 3.7 3.2*  CL 103 103 106  CO2 14* 17* 16*  BUN 29* 33* 33*  CREATININE 2.67* 2.58* 2.76*  GLUCOSE 234* 168* 189*    Electrolytes  Recent Labs Lab 30-Sep-2015 2355 09/29/2015 0521 09/27/2015 1702  CALCIUM 7.8* 8.0* 7.4*  MG 1.1* 2.7*  --   PHOS 3.4 3.8  --     CBC  Recent Labs Lab September 30, 2015 2359 09/20/2015 0521 09/14/2015 1702  WBC 22.0* 14.4* 4.1  HGB 11.3* 10.9* 11.5*  HCT 35.4* 34.0* 36.2  PLT 288 254 202    Coag's  Recent Labs Lab Sep 30, 2015 2355 09/27/2015 1318 09/23/2015 2024  APTT  --   --  47*  INR 1.41 1.35 1.62    Sepsis Markers  Recent Labs Lab 2015-09-30 2355 Sep 30, 2015 2356 10/02/2015 0259 09/05/2015 1702  LATICACIDVEN  --  7.3* 4.2* 5.9*  PROCALCITON 48.41  --   --   --     ABG  Recent Labs Lab 09/28/2015 2110 09/11/2015 2244 09/11/2015 2328  PHART 6.972* 7.089* 6.957*  PCO2ART 53.3* 49.5* 57.4*  PO2ART 182.0* 128.0* 97.0    Liver Enzymes  Recent Labs Lab Sep 30, 2015 2355 09/23/2015 1702  AST 27 26  ALT 15 15  ALKPHOS 60 300*  BILITOT 0.9 1.3*  ALBUMIN 2.6* 2.1*    Cardiac Enzymes  Recent Labs Lab 09/13/2015 0521 09/21/2015 1027 09/02/2015 1702  TROPONINI 2.15* 2.03* 1.78*    Glucose  Recent  Labs Lab 09/26/2015 0051 09/28/2015 0436 09/18/2015 0713 09/27/2015 1140 09/17/2015 1658 09/13/2015 2010  GLUCAP 203* 184* 168* 163* 129* 191*    Imaging No results found.   STUDIES:  KUB > 8mm calculus in R renal pelvis.  Abdominal US 8/22 > Obstructing 1 cm right UPJ calculus, gallbladder adenomyomatosis.   CULTURES: Blood 8/22> Urine 8/22 > Cultures from [facility] 8/22 >  ANTIBIOTICS: Levaquin 8/22 > Gentamycin 8/22 >  SIGNIFICANT EVENTS: 8/22 admit to [facility] for pyelo > transfer to [facility] septic shock  LINES/TUBES:   ASSESSMENT / PLAN:  PULMONARY A: At risk for pulmonary congestion/volume overload RVD 2/2 Obesity Likely with untreated OSA (with snoring, witnessed apneas, obese, crowded airway) P:   Keep O2 saturation more than 88%. Watch out for congestion. CXR now (baseline) We'll need a sleep study as  an outpatient. May Need ABG if with note of SOB/dyspnea/hypersomnia  CARDIOVASCULAR A:  Septic shock, 2/2 UTI.  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 She is on Neo-Synephrine at 120 mcg/min and her MAP is > 65 mmHg. Try to wean off Neo-Synephrine. Check troponins. Check 2-D echo.  RENAL A:   AKI/Acute renal failure - pre-renal due to sepsis/shock.  Possible intrarenal as well secondary to obstructive uropathy with R UPJ calculus AG acidosis - lactic  P:   Cont IVF (maintenance). Has received 4.5 L since being admitted this morning.  Check lytes.  Will need to call urology in am. Likely will need a stent/nephrostomy  GASTROINTESTINAL A:   GERD P:   NPO for now.  PPI.  Check amylase, lipase, LFTs  HEMATOLOGIC A:   No issues P:  Will observe.   INFECTIOUS A:   Severe Sepsis secondary to pyelonephritis / UTI 2/2 R UPJ calculus Recurrent UTI  P:   ABX as above Levophed has been weaned off. Wean off Neosynpehrine. Keep MAP > 65 mm Hg Follow cultures Obtain culture data from  [facility]. > This year, patient has been on Ciprofloxacin in January, Levaquin in May and June, and [place] in July. We need to get cultures from Urologist office so we will be guided accordingly. Pt with PCN and sulfa allergy. She has not received aminoglycosides before. Not sure if she has received cephalosporins in the past. PCT Check lactate  ENDOCRINE A:   DM Hypothyroidism    P:   CBG monitoring and SSI Start home meds of syntroid.   NEUROLOGIC A:   Pain 2/2 Kidney stone P:   RASS goal: 0 Fentanyl prn   FAMILY  - Updates: Husband was updated at bedside.   - Inter-disciplinary family meet or Palliative Care meeting due by:  09/30/15   Critical care time with this patient today : 30 minutes.   Pollie Meyer, MD 09/28/2015, 2:15 PM [place] [facility] Pager [phone] After 3 pm or if no answer, call [phone]  Addendum : Patient had a complicated ICU course.  Pls see last progress note :    PULMONARY / CRITICAL CARE MEDICINE   Name: SELINE ENZOR MRN: [id number] DOB: 1950-07-02    ADMISSION DATE:  09/30/2015 CONSULTATION DATE:  09/12/2015  REFERRING MD:  Maryruth Bun Dr. Nechama Guard  CHIEF COMPLAINT:  Shock/Pyelonephritis  HISTORY OF PRESENT ILLNESS:   65 year old female with PMH as below, which is significant for DM, HTN, hypothyroidism, and GERD. She was admitted to [facility] with diagnosis of UTI/pyelonephritis. KUB and Ultrasound demonstrate obstructing stone in R renal pelvis. She was hypotensive with lactic acid initially of 8 requiring 4L IVF resuscitation and subsequently vasoactive infusions.  She was transferred to [facility] for further evaluation and urology consultation.   Patient has had recurrent UTIs since she was 65 years old. Her current urologist is Dr. Nechama Guard over at [facility]. She's not sure why she has recurrent UTI but allegedly, workup has been unremarkable until this admission. Historically, she would get  a UTI every month and will be placed on antibiotics. At certain times, she would be on an abx  for a year and that would control the UTI. Since December 2016, she has had UTI almost monthly. Prior to that, she was UTI free for 7 years at least. Since January of this year, she  has been on antibiotics almost monthly. She has taken the following: Ciprofloxacin, levofloxacin, Bactroban. Over the weekend, she started having some fevers, chills, nausea, vomiting, abdominal pain, and right flank pain. She was admitted at [facility] and was diagnosed to have a stone in her right kidney. She was then subsequently transferred here.    PAST MEDICAL HISTORY :  She  has no past medical history on file.  Her current UTI. Allegedly workup has been negative in the past. Hypertension, HTN, DM, hypothyroidism, GERD.  Denies CAD.  (-) History of cancer or blood clot. No history of asthma or COPD.   PAST SURGICAL HISTORY: She  has a past surgical history that includes ir generic historical (09/29/2015).  (-) surgical history elicited.    Allergies  Allergen Reactions  . Penicillins Other (See Comments)    Has patient had a PCN reaction causing immediate rash, facial/tongue/throat swelling, SOB or lightheadedness with hypotension: No Has patient had a PCN reaction causing severe rash involving mucus membranes or skin necrosis: No Has patient had a PCN reaction that required hospitalization No Has patient had a PCN reaction occurring within the last 10 years: No If all of the above answers are "NO", then may proceed with Cephalosporin use.  Unknown, Childhood. Pt thinks that it may have been a rash.   . Sulfa Antibiotics Rash    She gets generalized rash and difficult to breathe when exposed to penicillin and sulfa drugs.   No current facility-administered medications on file prior to encounter.    No current outpatient prescriptions on file prior to encounter.    FAMILY HISTORY:  Her  has no family status information on file.  Father is 53 years old. He has diabetes and HTN. Mother is deceased. She had heart dse.   SOCIAL HISTORY: She  is married. 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 She smoked in her 55s for roughly 7 years, a pack would last maybe a week. Denies drinking alcohol.   REVIEW OF SYSTEMS:   Generalized weakness, fevers, chills, nausea, vomiting, anorexia, dyspnea, abdominal pain, right flank pain. She presented with all these symptoms and they're all somewhat better compared to this morning. 14 point review systems was in and everything else was negative.  SUBJECTIVE:   as mentioned.   VITAL SIGNS: BP (!) 55/31   Pulse (!) 117   Temp (!) 101.3 F (38.5 C) (Oral)   Resp (!) 30   Ht 5\' 4"  (1.626 m)   Wt 204 lb 2.3 oz (92.6 kg)   SpO2 98%   BMI 35.04 kg/m   HEMODYNAMICS:    VENTILATOR SETTINGS:    INTAKE / OUTPUT: I/O last 3 completed shifts: In: 1481.9 [I.V.:901.9; NG/GT:30; IV Piggyback:550] Out: 600 [Urine:600]  PHYSICAL EXAMINATION: General:  Awake, comfortable, No distress. Neuro:  Moves all four extremities. No focal deficits HEENT:  PERLA, No thyromegaly, JVD Cardiovascular:  RRR, No MRG Lungs: Clear, no wheeze or crackles.  Abdomen:  Soft, rt flank and upper quadrant tenderness. + BS Musculoskeletal:  Normal tone and bulk.  Skin:  Warm and dry.   LABS:  BMET  Recent Labs Lab 2015-09-30 2355 09/28/2015 0521 09/04/2015 1702  NA 132* 134* 133*  K 3.7 3.7 3.2*  CL 103 103 106  CO2 14* 17* 16*  BUN 29* 33* 33*  CREATININE 2.67* 2.58* 2.76*  GLUCOSE 234* 168* 189*    Electrolytes  Recent Labs Lab 09-30-15 2355 09/20/2015 0521 09/28/2015 1702  CALCIUM 7.8* 8.0* 7.4*  MG 1.1* 2.7*  --   PHOS 3.4 3.8  --     CBC  Recent Labs Lab 09/05/2015 2359 2015-10-11 0521 2015-10-11 1702  WBC 22.0* 14.4* 4.1  HGB 11.3* 10.9* 11.5*  HCT 35.4* 34.0* 36.2  PLT 288 254 202    Coag's  Recent  Labs Lab 09/26/2015 2355 2015-10-11 1318 10-11-2015 2024  APTT  --   --  47*  INR 1.41 1.35 1.62    Sepsis Markers  Recent Labs Lab 09/19/2015 2355 09/18/2015 2356 10-11-2015 0259 October 11, 2015 1702  LATICACIDVEN  --  7.3* 4.2* 5.9*  PROCALCITON 48.41  --   --   --     ABG  Recent Labs Lab 10/11/15 2110 Oct 11, 2015 2244 2015/10/11 2328  PHART 6.972* 7.089* 6.957*  PCO2ART 53.3* 49.5* 57.4*  PO2ART 182.0* 128.0* 97.0    Liver Enzymes  Recent Labs Lab 09/19/2015 2355 10-11-2015 1702  AST 27 26  ALT 15 15  ALKPHOS 60 300*  BILITOT 0.9 1.3*  ALBUMIN 2.6* 2.1*    Cardiac Enzymes  Recent Labs Lab 10-11-2015 0521 2015/10/11 1027 10-11-2015 1702  TROPONINI 2.15* 2.03* 1.78*    Glucose  Recent Labs Lab 2015-10-11 0051 2015-10-11 0436 11-Oct-2015 0713 10/11/15 1140 October 11, 2015 1658 10/11/2015 2010  GLUCAP 203* 184* 168* 163* 129* 191*    Imaging No results found. STUDIES:  KUB > 8mm calculus in R renal pelvis.  Abdominal US 8/22 > Obstructing 1 cm right UPJ calculus, gallbladder adenomyomatosis.   CULTURES: Blood 8/22> Urine 8/22 > Cultures from [facility] 8/22 >  ANTIBIOTICS: Levaquin 8/22 > 8/23 Gentamycin 8/22 > 8/23 Primaxin 8/23 >  SIGNIFICANT EVENTS: 8/22 admit to [facility] for pyelo > transfer to [facility] septic shock  LINES/TUBES:  ASSESSMENT / PLAN:  PULMONARY A: At risk for pulmonary congestion/volume overload RVD 2/2 Obesity Likely with untreated OSA (with snoring, witnessed apneas, obese, crowded airway) P:   Keep O2 saturation more than 88%. We'll need a sleep study as an outpatient. Repeat CXR to monitor CHF.  CARDIOVASCULAR A:  Septic shock, 2/2 UTI.  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 Demand ischemia vs ACS P:  Wean off neo Follow troponins. Started on ASA. Hold off on anticoagulation as she is going to OR.  Check 2-D echo.  RENAL A:   AKI/Acute renal failure - pre-renal due to sepsis/shock.  Possible  intrarenal as well secondary to obstructive uropathy with R UPJ calculus AG acidosis - lactic  P:   Cont maintenance IVF.  Check lytes.  Urology consulted. Will likely go to OR today.  GASTROINTESTINAL A:   GERD P:   NPO for now.  PPI.   HEMATOLOGIC A:   No issues P:  Monitor CBC.  INFECTIOUS A:   Severe Sepsis secondary to pyelonephritis / UTI 2/2 R UPJ calculus Recurrent UTI P:   Will cover broad with Primaxin since she has had multiple courses with levaquin in past Follow cultures Get  Levophed has been weaned off. Wean off Neosynpehrine. Keep MAP > 65 mm Hg We will obtain culture data from [facility] and Urology office.  Follow Pct and lactic acid.   ENDOCRINE A:   DM Hypothyroidism    P:   CBG monitoring and SSI Continue synthyroid.  NEUROLOGIC A:   Pain 2/2 Kidney stone P:   RASS goal: 0 Fentanyl prn  FAMILY  - Updates: Husband was updated at bedside.  - Inter-disciplinary family meet  or Palliative Care meeting due by:  09/30/15   Critical care time- 35 mins.  Chilton GreathousePraveen Mannam MD [place] [facility] Pager [phone] If no answer or after 3pm call: [phone] 09/28/2015, 2:16 PM     Addendum : Pt ended up having surgery with percutaneous ostomy placed for the kidney stone.  She ended up having acute resp failure/septic shock/metabolic and lactic acidosis post procedure. I ended up seeing the pt just before she expired:     [facility] P[facility]  I walked inside the room and saw pt moribund.  She was pale white and diaphoretic. BP was in 60sys, HR in 130s, RR in 20s. Sedated. Not in distress. (-) NVD. Very soft heart sounds. (-) s3/m/r/g. Good air entry. Hypoactive BS. Cool distal extremities, faint pulse.   EKG showed sinus tachcardia, ST elevation in inferolateral walls. Pt went into ACS prior to code.  Pt then went into flatline/arrest.  We gave 2 amps of  NaHCO3 for metabolic acidosis with the ABG prior to arrests.  We started CPR. Went  into Vfib x 1 during arrest and she got 120J shock. Subsequently revived after 9 mins.   I extensively d/w husband and daughters over all poor prognosis. Husband decided to make pt DNR.  I spoke to pt last night regarding code status and she had also implied "no heroic measures". \  Will continue present management. Cont Levophed drip at 20 mcg/kg/min. Cont neosynephrine drip at 200 mcg/kg/min. Will finish 1L bolus NS.  Cont HCO3 drip > will increase at 125 mls/hr.  Will hold off on heparin drip 2/2 pbleeding with nephrostomy tube.  ABG 6.9/54/89 on 100% Fio2 and PEEP of 15. Will adjust vent to increase her MV.    Critical care time spent with this pt today : 30 minutes.   Pollie MeyerJ. Angelo A de Dios, MD 09/28/2015, 2:17 PM [place] [facility] Pager [phone] After 3 pm or if no answer, call [phone]   Addendum : pt then went into cardiac arrest. Pls see below.   Pt went into bradycardia then asystole. Pt pronounced dead at 11:46 pm. Family at bedside and aware. Pls provide post mortem care.    Pollie MeyerJ. Angelo A de Dios, MD 09/28/2015, 2:18 PM [place] [facility] Pager [phone] After 3 pm or if no answer, call [phone]

## 2015-10-01 LAB — CULTURE, BLOOD (ROUTINE X 2)
Culture: NO GROWTH
Culture: NO GROWTH

## 2015-10-03 ENCOUNTER — Telehealth: Payer: Self-pay

## 2015-10-03 NOTE — Sedation Documentation (Signed)
Patient is resting comfortably. 

## 2015-10-03 NOTE — Progress Notes (Signed)
One yellow ring removed from patients finger and given to husband. Lorin PicketLindsey Raj Landress RN

## 2015-10-03 NOTE — Procedures (Signed)
Arterial Catheter Insertion Procedure Note Carrie Townsend [id number] 10-23-1950  Procedure: Insertion of Arterial Catheter  Indications: Blood pressure monitoring  Procedure Details Consent: Unable to obtain consent because of emergent medical necessity. Time Out: Verified patient identification, verified procedure, site/side was marked, verified correct patient position, special equipment/implants available, medications/allergies/relevent history reviewed, required imaging and test results available.  Performed  Maximum sterile technique was used including antiseptics, cap, gloves, gown, hand hygiene, mask and sheet. Skin prep: Chlorhexidine; local anesthetic administered 20 gauge catheter was inserted into right femoral artery using the Seldinger technique.  Evaluation Blood flow good; BP tracing good. Complications: No apparent complications.  US used in placement.   Joneen RoachPaul Tyrion Glaude, AGACNP-BC [facility] Pager [phone] or [phone]  09/02/2015 11:25 PM

## 2015-10-03 NOTE — Progress Notes (Signed)
eLink Physician-Brief Progress Note Patient Name: Carrie Townsend DOB: [birth year] MRN: [id number]   Date of Service  2015-11-22  HPI/Events of Note  Lactate slight better from 8 to 7.3 , low dose neo gtt Mg 1.1 Trop 1.8 High CBgs  eICU Interventions  EKG, ASA, trend trop & lactate Replete Mg SSI-  mod     Intervention Category Major Interventions: Electrolyte abnormality - evaluation and management;Hyperglycemia - active titration of insulin therapy;Sepsis - evaluation and management  [name] V. 2015-11-22, 12:58 AM

## 2015-10-03 NOTE — Progress Notes (Signed)
eLink Physician-Brief Progress Note Patient Name: Carrie Townsend DOB: 1950-03-01 MRN: [id number]   Date of Service  Apr 19, 2015  HPI/Events of Note  Multiple issues: 1. Hypotension - BP = 79/58 and 2. K+ - 3.2 and Creatinine = 2.76  eICU Interventions  Will order: 1. Monitor CVP 2. ABG now. 3. Cautiously replace K+.      Intervention Category Major Interventions: Hypotension - evaluation and management  Jeyda Siebel Dennard Nipugene Apr 19, 2015, 7:49 PM

## 2015-10-03 NOTE — Progress Notes (Signed)
eLink Physician-Brief Progress Note Patient Name: Carrie Townsend DOB: 1950/10/25 MRN: [id number]   Date of Service  2015/11/05  HPI/Events of Note  Troponin = 2.03 >> 1.78. Already on ASA.  Troponin trending down. Demand ischemia?  eICU Interventions  Continue to trend Troponin.      Intervention Category Intermediate Interventions: Diagnostic test evaluation  Lenell AntuSommer,Steven Eugene 2015/11/05, 7:54 PM

## 2015-10-03 NOTE — Progress Notes (Signed)
Art line attempted X2 unsuccessful. Got back pulsatile Blood flow both attempts but catheter wouldn't tread. Renae FicklePaul, NP aware of art line unsuccessful. RN aware. No hematoma noted. Pressure applied.

## 2015-10-03 NOTE — Progress Notes (Signed)
CVP set up. RN aware 

## 2015-10-03 NOTE — Progress Notes (Signed)
Pharmacy Antibiotic Note  Carrie Townsend is a 65 y.o. female admitted on 09/02/2015 [facility] with pyelonephritis/urosepsis. [facility]   Patient's antibiotics were changed to Primaxin today. She stated that she thought her allergy to penicillin was a rash but it was a long time ago. She also has a history of recent ciprofloxacin and levofloxacin use for UTIs, and was taking nitrofurantion for UTI prevention.   Patient's WBC is 14.4 and is afebrile today. Her PCT is 48.41 and lactate 4.2. SCr is elevated at 2.58 mg/dL.   Plan: Primaxin 500mg  IV q12h  Monitor renal function, C/S, and clinical progress   Height: 5\' 4"  (162.6 cm) Weight: 204 lb 2.3 oz (92.6 kg) IBW/kg (Calculated) : 54.7  Temp (24hrs), Avg:97.8 F (36.6 C), Min:97.5 F (36.4 C), Max:98.1 F (36.7 C)  Labs: ( [facility]) WBC  25.5 Hgb  11.1 Hct  33.1 Plt  296  SCr  2.23    Recent Labs Lab 09/17/2015 2355 09/11/2015 2356 09/29/2015 2359 2015/02/24 0259 2015/02/24 0521  WBC  --   --  22.0*  --  14.4*  CREATININE 2.67*  --   --   --  2.58*  LATICACIDVEN  --  7.3*  --  4.2*  --     Estimated Creatinine Clearance: 24.3 mL/min (by C-G formula based on SCr of 2.58 mg/dL).    Allergies  Allergen Reactions  . Penicillins Other (See Comments)    Unknown, Childhood. Pt thinks that it may have been a rash.   . Sulfa Antibiotics Rash   Antibiotics this Admission:  Primaxin 8/23 >> [facility]  Gentamycin [facility]   Labs/Cultures: 8/22 BCx x2: sent  8/22 MRSA PCR: negative   Carrie Townsend 25-Mar-2015 9:56 AM

## 2015-10-03 NOTE — Procedures (Signed)
Central Line Insertion Procedure Note  Consent:  Performed emergently with patient in septic shock and acute hypoxic respiratory failure.  Laterality: Left Internal Jugular Vein  Description of Procedure: A time out was performed identifying correct patient and procedure. Patient was prepped and draped in sterile fashion.  Using bedside ultrasound the patient's left internal jugular vein was visualized.  The vein was compressible and nonpulsatile. Finder needle was then inserted into the patient's vein under direct ultrasound visualization and dark blood was aspirated.  The syringe was removed form the needle and blood flow was nonpulsatile.  The guidewire was then inserted through the needle into the patient's vein and needle was then removed.  Guidewire positioning in the vein was confirmed with bedside ultrasound. A skin nick was made with a sterile scalpel.  A dilator was passed over the guidewire into the subcutaneous tissue then removed.  The central venous catheter was then inserted over the guidewire into the vein.  The guidewire was then removed.  All ports were then aspirated and free of air before being flushed with sterile normal saline.  The ports were then clamped.  The catheter was sewen into place and a chlorhexidine bandage was applied.   Complications:  None.  Estimated Blood Loss:  Less than 15 cc.  Condition Post Procedure:  Remains critically ill in the intensive care unit. Awaiting stat portable chest x-ray.

## 2015-10-03 NOTE — Progress Notes (Addendum)
eLink Physician-Brief Progress Note Patient Name: Carrie Townsend DOB: 25-Mar-1950 MRN: [id number]   Date of Service  25-Sep-2015  HPI/Events of Note  Temp to 105.0 F, rigors and increased RR to 40's s/p perc nephrostomy. RR =  35 now. Nurse requests pain medication. BP = 119/65.  eICU Interventions  Will order: 1. Will order BiPAP. 2. Fentanyl 25-100 mcg IV Q 2 hours PRN pain.      Intervention Category Intermediate Interventions: Respiratory distress - evaluation and management  [name] Eugene 25-Sep-2015, 4:48 PM

## 2015-10-03 NOTE — Progress Notes (Signed)
Critical ABG results given to Renae FicklePaul, NP. NP noted for me to increase RR from 28 to 30.

## 2015-10-03 NOTE — Progress Notes (Signed)
No Vent check done at this time. Family at bedside. Patient is decompensating. RN/NP/MD aware of patient status.

## 2015-10-03 NOTE — Sedation Documentation (Signed)
02 sats 84-85%- NRB mask applied. Sats  improved 96%. Pt on belly for procedure.

## 2015-10-03 NOTE — Progress Notes (Signed)
ABG collected  

## 2015-10-03 NOTE — Progress Notes (Signed)
Critical ABG results given to Renae FicklePaul, NP

## 2015-10-03 NOTE — H&P (Signed)
Chief Complaint: Patient was seen in consultation today for right percutaneous nephrostomy placement at the request of Dr Belva CromeP Mannam  Referring Physician(s): Dr Heloise PurpuraLester Borden  Supervising Physician: Jolaine ClickHoss, Arthur  Patient Status: Inpatient  History of Present Illness: Carrie Townsend is a 65 y.o. female   Pt with Hx UTIs for most her lifetime No known Hx renal stones Developed Rt flank pain x 4-5 days ago Worsened and [facility] Renal US reveals Rt hydronephrosis with 1 cm UPJ stone per notes [facility] and possible treatment Request for percutaneous nephrostomy placement Dr Bonnielee HaffHoss has reviewed imaging and approves procedure Scheduled now for same Cr 2.58 today Troponin 2.15---2.03  No past medical history on file.  No past surgical history on file.  Allergies: Penicillins and Sulfa antibiotics  Medications: Prior to Admission medications   Medication Sig Start Date End Date Taking? Authorizing Provider  allopurinol (ZYLOPRIM) 100 MG tablet Take 200 mg by mouth at bedtime.   Yes Historical Provider, MD  aspirin EC 81 MG tablet Take 81 mg by mouth at bedtime.   Yes Historical Provider, MD  chlorthalidone (HYGROTON) 25 MG tablet Take 25 mg by mouth daily.   Yes Historical Provider, MD  estradiol (ESTRACE) 0.1 MG/GM vaginal cream Place 1 Applicatorful vaginally 3 (three) times a week. Pat Kocherues, Thur, Sat   Yes Historical Provider, MD  lansoprazole (PREVACID) 15 MG capsule Take 15 mg by mouth every other day.   Yes Historical Provider, MD  levothyroxine (SYNTHROID, LEVOTHROID) 25 MCG tablet Take 37.5 mcg by mouth daily before breakfast.   Yes Historical Provider, MD  Liraglutide (VICTOZA) 18 MG/3ML SOPN Inject 7.2 mg into the skin daily. 1.2 mL   Yes Historical Provider, MD  lisinopril (PRINIVIL,ZESTRIL) 40 MG tablet Take 40 mg by mouth daily.   Yes Historical Provider, MD  metFORMIN (GLUCOPHAGE) 1000 MG tablet Take 1,000 mg by mouth 2 (two)  times daily with a meal.   Yes Historical Provider, MD  metoprolol (LOPRESSOR) 50 MG tablet Take 50-100 mg by mouth 2 (two) times daily. Take 2 tablets in the AM and 1 tablet in the PM   Yes Historical Provider, MD  nitrofurantoin, macrocrystal-monohydrate, (MACROBID) 100 MG capsule Take 100 mg by mouth at bedtime. 9 month duration   Yes Historical Provider, MD  promethazine (PHENERGAN) 25 MG tablet Take 12.5-25 mg by mouth every 6 (six) hours as needed for nausea or vomiting.   Yes Historical Provider, MD     No family history on file.  Social History   Social History  . Marital status: Married    Spouse name: N/A  . Number of children: N/A  . Years of education: N/A   Social History Main Topics  . Smoking status: Not on file  . Smokeless tobacco: Not on file  . Alcohol use Not on file  . Drug use: Unknown  . Sexual activity: Not on file   Other Topics Concern  . Not on file   Social History Narrative  . No narrative on file    Review of Systems: A 12 point ROS discussed and pertinent positives are indicated in the HPI above.  All other systems are negative.  Review of Systems  Constitutional: Positive for activity change, appetite change, fatigue and fever.  Respiratory: Negative for shortness of breath.   Gastrointestinal: Positive for abdominal pain.  Genitourinary: Positive for flank pain.  Psychiatric/Behavioral: Negative for behavioral problems and confusion.    Vital Signs:  BP 105/72   Pulse 96   Temp 97.6 F (36.4 C) (Oral)   Resp (!) 30   Ht 5\' 4"  (1.626 m)   Wt 204 lb 2.3 oz (92.6 kg)   SpO2 96%   BMI 35.04 kg/m   Physical Exam  Constitutional: She is oriented to person, place, and time.  Cardiovascular: Normal rate and regular rhythm.   Pulmonary/Chest: Breath sounds normal.  Abdominal: Soft. Bowel sounds are normal.  Musculoskeletal: Normal range of motion.  Neurological: She is alert and oriented to person, place, and time.  Skin: Skin is warm  and dry.  Psychiatric: She has a normal mood and affect. Her behavior is normal. Judgment and thought content normal.  Nursing note and vitals reviewed.   Mallampati Score:  MD Evaluation Airway: WNL Heart: WNL Abdomen: WNL Chest/ Lungs: WNL ASA  Classification: 3, 2 Mallampati/Airway Score: One  Imaging: Dg Chest Port 1 View  Result Date: 11-27-2015 CLINICAL DATA:  Increasing shortness of breath EXAM: PORTABLE CHEST 1 VIEW COMPARISON:  10/01/2015 FINDINGS: Chronic cardiopericardial enlargement. Cephalized blood flow with interstitial prominence. There is no Kerley line, consolidation, effusion, or pneumothorax. IMPRESSION: Cardiomegaly and pulmonary venous congestion. Electronically Signed   By: Marnee SpringJonathon  Watts M.D.   On: 010-26-2017 00:30    Labs:  CBC:  Recent Labs  09/22/2015 2359 2016-01-16 0521  WBC 22.0* 14.4*  HGB 11.3* 10.9*  HCT 35.4* 34.0*  PLT 288 254    COAGS:  Recent Labs  09/08/2015 2355  INR 1.41    BMP:  Recent Labs  09/17/2015 2355 2016-01-16 0521  NA 132* 134*  K 3.7 3.7  CL 103 103  CO2 14* 17*  GLUCOSE 234* 168*  BUN 29* 33*  CALCIUM 7.8* 8.0*  CREATININE 2.67* 2.58*  GFRNONAA 18* 19*  GFRAA 21* 21*    LIVER FUNCTION TESTS:  Recent Labs  10/01/2015 2355  BILITOT 0.9  AST 27  ALT 15  ALKPHOS 60  PROT 5.8*  ALBUMIN 2.6*    TUMOR MARKERS: No results for input(s): AFPTM, CEA, CA199, CHROMGRNA in the last 8760 hours.  Assessment and Plan:  Rt hydronephrosis Renal stone Cr 2.53 Leukocytosis Scheduled for Rt percutaneous nephrostomy placement Risks and Benefits discussed with the patient including, but not limited to infection, bleeding, significant bleeding causing loss or decrease in renal function or damage to adjacent structures.  All of the patient's questions were answered, patient is agreeable to proceed. Consent signed and in chart.   Thank you for this interesting consult.  I greatly enjoyed meeting Carrie Townsend  and look forward to participating in their care.  A copy of this report was sent to the requesting provider on this date.  Electronically Signed: Ralene MuskratURPIN,Merlon Alcorta A 11-27-2015, 1:34 PM   I spent a total of 40 Minutes    in face to face in clinical consultation, greater than 50% of which was counseling/coordinating care for Rt PCN

## 2015-10-03 NOTE — Procedures (Signed)
Intubation Procedure Note Carrie Townsend [id number] Mar 28, 1950  Procedure: Intubation Indications: Respiratory insufficiency  Procedure Details Consent: Risks of procedure as well as the alternatives and risks of each were explained to the (patient/caregiver).  Consent for procedure obtained. Time Out: Verified patient identification, verified procedure, site/side was marked, verified correct patient position, special equipment/implants available, medications/allergies/relevent history reviewed, required imaging and test results available.  Performed  Hyacinth MeekerMiller and 3   Evaluation Hemodynamic Status: Transient hypotension treated with pressors and treated with fluid; O2 sats: transiently fell during during procedure Patient's Current Condition: stable Complications: No apparent complications Patient did tolerate procedure well. Chest X-ray ordered to verify placement.  CXR: pending.   Carrie Townsend 09/14/2015

## 2015-10-03 NOTE — Care Management Note (Signed)
Case Management Note  Patient Details  Name: Carrie Townsend MRN: [id number] Date of Birth: October 15, 1950  Subjective/Objective:Marland Kitchen.  [facility] and [facility] - pt has extensive history of UTI's - this admit positive obstructive renal stone                 Action/Plan:  PTA from home.  Attending note mentioned she will probably need outpt sleep study - CM requested order and CM consult via sticky note if it is still deemed necessary at discharge - currently pt is on RA.  CM will continue to monitor for discharge needs   Expected Discharge Date:                  Expected Discharge Plan:  Home/Self Care  In-House Referral:     Discharge planning Services  CM Consult  Post Acute Care Choice:    Choice offered to:     DME Arranged:    DME Agency:     HH Arranged:    HH Agency:     Status of Service:  In process, will continue to follow  If discussed at Long Length of Stay Meetings, dates discussed:    Additional Comments:  Cherylann ParrClaxton, Evee Liska S, RN 10/01/2015, 8:55 AM

## 2015-10-03 NOTE — Progress Notes (Signed)
Attempted ABG twice, UNSUCCESSFUL. I can not palpitate any radial or brachial pulses. RN aware. Pt is stable at this time, family at the bedside.

## 2015-10-03 NOTE — Progress Notes (Signed)
Pharmacy Antibiotic Note  Carrie Townsend is a 65 y.o. female admitted on 10/15/15 [facility] with pyelonephritis/urosepsis. She [facility]ofl[facility] and gentamycin [facility] and now on Primaxin. She is now noted with fever and also intubated today. Pharmacy consulted to add vancomycin. -WBC= 14.4, tmax= 100.5 -Carrie Townsend= 2.58, CrCl ~ 34   Plan: -Vancomycin 1750mg  x1 followed by 1000mg  IV q24hr Monitor renal function, C/S, and clinical progress   Height: 5\' 4"  (162.6 cm) Weight: 204 lb 2.3 oz (92.6 kg) IBW/kg (Calculated) : 54.7  Temp (24hrs), Avg:98.3 F (36.8 C), Min:97.5 F (36.4 C), Max:100.5 F (38.1 C)   Recent Labs Lab 2015/11/22 2355 2015/11/22 2356 2015/11/22 2359 09/25/2015 0259 09/19/2015 0521  WBC  --   --  22.0*  --  14.4*  CREATININE 2.67*  --   --   --  2.58*  LATICACIDVEN  --  7.3*  --  4.2*  --     Estimated Creatinine Clearance: 24.3 mL/min (by C-G formula based on SCr of 2.58 mg/dL).    Allergies  Allergen Reactions  . Penicillins Other (See Comments)    Has patient had a PCN reaction causing immediate rash, facial/tongue/throat swelling, SOB or lightheadedness with hypotension: No Has patient had a PCN reaction causing severe rash involving mucus membranes or skin necrosis: No Has patient had a PCN reaction that required hospitalization No Has patient had a PCN reaction occurring within the last 10 years: No If all of the above answers are "NO", then may proceed with Cephalosporin use.  Unknown, Childhood. Pt thinks that it may have been a rash.   . Sulfa Antibiotics Rash   Antibiotics this Admission:  Vanc 8/23>> Primaxin 8/23 >> [facility]  Gentamycin [facility]   Labs/Cultures: 8/23 urine 8/22 BCx x2: sent  8/22 MRSA PCR: negative   Carrie Townsend, Pharm D 09/19/2015 6:13 PM

## 2015-10-03 NOTE — Sedation Documentation (Signed)
MD spoke with pt and family

## 2015-10-03 NOTE — Progress Notes (Signed)
PULMONARY / CRITICAL CARE MEDICINE   Name: Carrie Townsend MRN: [id number] DOB: 1950/12/02    ADMISSION DATE:  03-21-15 CONSULTATION DATE:  03-21-15  REFERRING MD:  [facility] Dr[facility] Nechama GuardBauer  CHIEF COMPLAINT:  Shock/Pyelonephritis  HISTORY OF PRESENT ILLNESS:   65 year old female with PMH as below, which is significant for DM, HTN, hypothyroidism, and GERD. She was [facility] with diagnosis of UTI/pyelonephritis. KUB and Ultrasound demonstrate obstructing stone in R renal pelvis. She was hypotensive with lactic acid initially of 8 requiring 4L IVF resuscitation and subsequently vasoactive infusions.  She was transferred [facility] for further evaluation and urology consultation.   Patient has had recurrent UTIs since she was 65 years old. Her current urologist is Dr. Nechama GuardBauer [facility]. She's not sure why she has recurrent UTI but allegedly, workup has been unremarkable until this admission. Historically, she would get a UTI every month and will be placed on antibiotics. At certain times, she would be on an abx  for a year and that would control the UTI. Since December 2016, she has had UTI almost monthly. Prior to that, she was UTI free for 7 years at least. Since January of this year, she has been on antibiotics almost monthly. She has taken the following: Ciprofloxacin, levofloxacin, Bactroban. Over the weekend, she started having some fevers, chills, nausea, vomiting, abdominal pain, and right flank pain. She was admitted [facility] and was diagnosed to have a stone in her right kidney. She was then subsequently transferred here.    PAST MEDICAL HISTORY :  She  has no past medical history on file.  Her current UTI. Allegedly workup has been negative in the past. Hypertension, HTN, DM, hypothyroidism, GERD.  Denies CAD.  (-) History of cancer or blood clot. No history of asthma or COPD.   PAST SURGICAL HISTORY: She  has no past  surgical history on file.  (-) surgical history elicited.    Allergies  Allergen Reactions  . Penicillins Other (See Comments)    Unknown, Childhood. Pt thinks that it may have been a rash.   . Sulfa Antibiotics Rash    She gets generalized rash and difficult to breathe when exposed to penicillin and sulfa drugs.   No current facility-administered medications on file prior to encounter.    No current outpatient prescriptions on file prior to encounter.    FAMILY HISTORY:  Her has no family status information on file.  Father is 64 years old. He has diabetes and HTN. Mother is deceased. She had heart dse.   SOCIAL HISTORY: She  is married. She currently works and does office work. She works for [employer]. She smoked in her 1220s for roughly 7 years, a pack would last maybe a week. Denies drinking alcohol.   REVIEW OF SYSTEMS:   Generalized weakness, fevers, chills, nausea, vomiting, anorexia, dyspnea, abdominal pain, right flank pain. She presented with all these symptoms and they're all somewhat better compared to this morning. 14 point review systems was in and everything else was negative.  SUBJECTIVE:   as mentioned.   VITAL SIGNS: BP 99/65 (BP Location: Left Arm)   Pulse 96   Temp 97.7 F (36.5 C) (Oral)   Resp (!) 25   Ht 5\' 4"  (1.626 m)   Wt 204 lb 2.3 oz (92.6 kg)   SpO2 95%   BMI 35.04 kg/m   HEMODYNAMICS:    VENTILATOR SETTINGS: FiO2 (%):  [0 %]  0 %  INTAKE / OUTPUT: I/O last 3 completed shifts: In: 1428.8 [I.V.:1328.8; IV Piggyback:100] Out: 50 [Urine:50]  PHYSICAL EXAMINATION: General:  Awake, comfortable, No distress. Neuro:  Moves all four extremities. No focal deficits HEENT:  PERLA, No thyromegaly, JVD Cardiovascular:  RRR, No MRG Lungs: Clear, no wheeze or crackles.  Abdomen:  Soft, rt flank and upper quadrant tenderness. + BS Musculoskeletal:  Normal tone and bulk.  Skin:  Warm and dry.   LABS:  BMET  Recent Labs Lab  09/22/2015 2355 October 08, 2015 0521  NA 132* 134*  K 3.7 3.7  CL 103 103  CO2 14* 17*  BUN 29* 33*  CREATININE 2.67* 2.58*  GLUCOSE 234* 168*    Electrolytes  Recent Labs Lab 09/14/2015 2355 2015/10/08 0521  CALCIUM 7.8* 8.0*  MG 1.1* 2.7*  PHOS 3.4 3.8    CBC  Recent Labs Lab 09/15/2015 2359 10-08-15 0521  WBC 22.0* 14.4*  HGB 11.3* 10.9*  HCT 35.4* 34.0*  PLT 288 254    Coag's  Recent Labs Lab 10/01/2015 2355  INR 1.41    Sepsis Markers  Recent Labs Lab 09/14/2015 2355 09/13/2015 2356 10/08/15 0259  LATICACIDVEN  --  7.3* 4.2*  PROCALCITON 48.41  --   --     ABG No results for input(s): PHART, PCO2ART, PO2ART in the last 168 hours.  Liver Enzymes  Recent Labs Lab 09/02/2015 2355  AST 27  ALT 15  ALKPHOS 60  BILITOT 0.9  ALBUMIN 2.6*    Cardiac Enzymes  Recent Labs Lab 09/16/2015 2355 2015-10-08 0521  TROPONINI 1.83* 2.15*    Glucose  Recent Labs Lab 09/22/2015 2223 10-08-2015 0051 10/08/2015 0436 October 08, 2015 0713  GLUCAP 242* 203* 184* 168*    Imaging Dg Chest Port 1 View  Result Date: 10/08/2015 CLINICAL DATA:  Increasing shortness of breath EXAM: PORTABLE CHEST 1 VIEW COMPARISON:  09/23/2015 FINDINGS: Chronic cardiopericardial enlargement. Cephalized blood flow with interstitial prominence. There is no Kerley line, consolidation, effusion, or pneumothorax. IMPRESSION: Cardiomegaly and pulmonary venous congestion. Electronically Signed   By: Marnee Spring M.D.   On: 10-08-2015 00:30   STUDIES:  KUB > 8mm calculus in R renal pelvis.  Abdominal US 8/22 > Obstructing 1 cm right UPJ calculus, gallbladder adenomyomatosis.   CULTURES: Blood 8/22> Urine 8/22 > Cultures from [facility] 8/22 >  ANTIBIOTICS: Levaquin 8/22 > 8/23 Gentamycin 8/22 > 8/23 Primaxin 8/23 >  SIGNIFICANT EVENTS: 8/22 admit to [facility] for pyelo > transfer to [facility] septic shock  LINES/TUBES:  ASSESSMENT / PLAN:  PULMONARY A: At risk for pulmonary congestion/volume  overload RVD 2/2 Obesity Likely with untreated OSA (with snoring, witnessed apneas, obese, crowded airway) P:   Keep O2 saturation more than 88%. We'll need a sleep study as an outpatient. Repeat CXR to monitor CHF.  CARDIOVASCULAR A:  Septic shock, 2/2 UTI.  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 Demand ischemia vs ACS P:  Wean off neo Follow troponins. Started on ASA. Hold off on anticoagulation as she is going to OR.  Check 2-D echo.  RENAL A:   AKI/Acute renal failure - pre-renal due to sepsis/shock.  Possible intrarenal as well secondary to obstructive uropathy with R UPJ calculus AG acidosis - lactic  P:   Cont maintenance IVF.  Check lytes.  Urology consulted. Will likely go to OR today.  GASTROINTESTINAL A:   GERD P:   NPO for now.  PPI.   HEMATOLOGIC A:  No issues P:  Monitor CBC.  INFECTIOUS A:   Severe Sepsis secondary to pyelonephritis / UTI 2/2 R UPJ calculus Recurrent UTI P:   Will cover broad with Primaxin since she has had multiple courses with levaquin in past Follow cultures Get  Levophed has been weaned off. Wean off Neosynpehrine. Keep MAP > 65 mm Hg We will obtain [facility].  Follow Pct and lactic acid.   ENDOCRINE A:   DM Hypothyroidism    P:   CBG monitoring and SSI Continue synthyroid.  NEUROLOGIC A:   Pain 2/2 Kidney stone P:   RASS goal: 0 Fentanyl prn  FAMILY  - Updates: Husband was updated at bedside.  - Inter-disciplinary family meet or Palliative Care meeting due by:  09/30/15   Critical care time- 35 mins.  Chilton GreathousePraveen Wren Gallaga MD [place] Pager [phone] If no answer or after 3pm call: [phone] 09/21/2015, 11:16 AM

## 2015-10-03 NOTE — Procedures (Signed)
R PCN 10 Fr Frank pus No comp/EBL

## 2015-10-03 NOTE — Progress Notes (Signed)
ABG collected with handheld doppler ultrasound.

## 2015-10-03 NOTE — Consult Note (Addendum)
Urology Consult   Physician requesting consult: Dr. Isaiah Serge  Reason for consult: Febrile UTI and 1 cm R UPJ stone  History of Present Illness: Carrie Townsend is a 65 y.o. female who was transferred from [facility] last night with sepsis.  She underwent imaging with a renal ultrasound that demonstrated right hydronephrosis and a 1 cm UPJ stone confirmed on KUB imaging.  She has had 4 days of right flank pain and feeling poorly with dysuria and fever/chills.  She denies a history of stones.  She has had recurrent UTIs since December treated with multiple rounds of antibiotics since then including just recently within the last few weeks.  She is currently hypotensive on phenylephrine gtt.   PMH: Diabetes, HTN, High cholesterol, GERD  PSH: C-section  Current Hospital Medications:    Scheduled Meds: . aspirin  325 mg Oral Daily  . heparin  5,000 Units Subcutaneous Q8H  . imipenem-cilastatin  500 mg Intravenous Q12H  . insulin aspart  0-15 Units Subcutaneous Q4H   Continuous Infusions: . sodium chloride 100 mL/hr at 08-Oct-2015 0631  . phenylephrine (NEO-SYNEPHRINE) Adult infusion 70 mcg/min (2015/10/08 1129)   PRN Meds:.sodium chloride, acetaminophen  Allergies:  Allergies  Allergen Reactions  . Penicillins Other (See Comments)    Unknown, Childhood. Pt thinks that it may have been a rash.   . Sulfa Antibiotics Rash    No family history on file.  Social History:  has no tobacco, alcohol, and drug history on file.  ROS: A complete review of systems was performed.  All systems are negative except for pertinent findings as noted.  Physical Exam:  Vital signs in last 24 hours: Temp:  [97.5 F (36.4 C)-98.1 F (36.7 C)] 97.7 F (36.5 C) (08/23 0715) Pulse Rate:  [94-117] 96 (08/23 1130) Resp:  [20-36] 30 (08/23 1130) BP: (76-107)/(47-68) 85/58 (08/23 1115) SpO2:  [92 %-99 %] 97 % (08/23 1130) FiO2 (%):  [0 %] 0 % (08/22 2238) Weight:  [92.4 kg (203 lb 11.3 oz)-92.6  kg (204 lb 2.3 oz)] 92.6 kg (204 lb 2.3 oz) (08/23 0444) Constitutional:  Alert and oriented, No acute distress Cardiovascular: Regular rate and rhythm, No JVD Respiratory: Normal respiratory effort, Lungs clear bilaterally GI: R CVAT and R abdominal pain GU: R CVAT Lymphatic: No lymphadenopathy Neurologic: Grossly intact, no focal deficits Psychiatric: Normal mood and affect  Laboratory Data:   Recent Labs  10/02/2015 2359 October 08, 2015 0521  WBC 22.0* 14.4*  HGB 11.3* 10.9*  HCT 35.4* 34.0*  PLT 288 254     Recent Labs  09/06/2015 2355 October 08, 2015 0521  NA 132* 134*  K 3.7 3.7  CL 103 103  GLUCOSE 234* 168*  BUN 29* 33*  CALCIUM 7.8* 8.0*  CREATININE 2.67* 2.58*     Results for orders placed or performed during the hospital encounter of 09/22/2015 (from the past 24 hour(s))  Glucose, capillary     Status: Abnormal   Collection Time: 09/17/2015 10:23 PM  Result Value Ref Range   Glucose-Capillary 242 (H) 65 - 99 mg/dL  MRSA PCR Screening     Status: None   Collection Time: 09/28/2015 10:24 PM  Result Value Ref Range   MRSA by PCR NEGATIVE NEGATIVE  Type and screen If need to transfuse blood products please use the blood administration order set     Status: None (Preliminary result)   Collection Time: 09/30/2015 11:42 PM  Result Value Ref Range   ABO/RH(D) A NEG    Antibody Screen POS  Sample Expiration 09/26/2015    DAT, IgG NEG    Antibody Identification ANTI D    Unit Number [id number]    Blood Component Type RED CELLS,LR    Unit division 00    Status of Unit ALLOCATED    Transfusion Status OK TO TRANSFUSE    Crossmatch Result COMPATIBLE    Unit Number [id number]    Blood Component Type RED CELLS,LR    Unit division 00    Status of Unit ALLOCATED    Transfusion Status OK TO TRANSFUSE    Crossmatch Result COMPATIBLE   Cortisol     Status: None   Collection Time: 09/07/2015 11:53 PM  Result Value Ref Range   Cortisol, Plasma 27.4 ug/dL  Comprehensive metabolic  panel     Status: Abnormal   Collection Time: 09/22/2015 11:55 PM  Result Value Ref Range   Sodium 132 (L) 135 - 145 mmol/L   Potassium 3.7 3.5 - 5.1 mmol/L   Chloride 103 101 - 111 mmol/L   CO2 14 (L) 22 - 32 mmol/L   Glucose, Bld 234 (H) 65 - 99 mg/dL   BUN 29 (H) 6 - 20 mg/dL   Creatinine, Ser 9.62 (H) 0.44 - 1.00 mg/dL   Calcium 7.8 (L) 8.9 - 10.3 mg/dL   Total Protein 5.8 (L) 6.5 - 8.1 g/dL   Albumin 2.6 (L) 3.5 - 5.0 g/dL   AST 27 15 - 41 U/L   ALT 15 14 - 54 U/L   Alkaline Phosphatase 60 38 - 126 U/L   Total Bilirubin 0.9 0.3 - 1.2 mg/dL   GFR calc non Af Amer 18 (L) >60 mL/min   GFR calc Af Amer 21 (L) >60 mL/min   Anion gap 15 5 - 15  Magnesium     Status: Abnormal   Collection Time: 09/25/2015 11:55 PM  Result Value Ref Range   Magnesium 1.1 (L) 1.7 - 2.4 mg/dL  Phosphorus     Status: None   Collection Time: 09/16/2015 11:55 PM  Result Value Ref Range   Phosphorus 3.4 2.5 - 4.6 mg/dL  Amylase     Status: Abnormal   Collection Time: 09/19/2015 11:55 PM  Result Value Ref Range   Amylase 27 (L) 28 - 100 U/L  Lipase, blood     Status: None   Collection Time: 09/10/2015 11:55 PM  Result Value Ref Range   Lipase 20 11 - 51 U/L  Troponin I     Status: Abnormal   Collection Time: 09/07/2015 11:55 PM  Result Value Ref Range   Troponin I 1.83 (HH) <0.03 ng/mL  Procalcitonin     Status: None   Collection Time: 09/21/2015 11:55 PM  Result Value Ref Range   Procalcitonin 48.41 ng/mL  Protime-INR     Status: Abnormal   Collection Time: 09/22/2015 11:55 PM  Result Value Ref Range   Prothrombin Time 17.4 (H) 11.4 - 15.2 seconds   INR 1.41   Lactic acid, plasma     Status: Abnormal   Collection Time: 09/07/2015 11:56 PM  Result Value Ref Range   Lactic Acid, Venous 7.3 (HH) 0.5 - 1.9 mmol/L  CBC with Differential/Platelet     Status: Abnormal   Collection Time: 09/29/2015 11:59 PM  Result Value Ref Range   WBC 22.0 (H) 4.0 - 10.5 K/uL   RBC 3.91 3.87 - 5.11 MIL/uL   Hemoglobin 11.3 (L)  12.0 - 15.0 g/dL   HCT 95.2 (L) 84.1 - 32.4 %   MCV  90.5 78.0 - 100.0 fL   MCH 28.9 26.0 - 34.0 pg   MCHC 31.9 30.0 - 36.0 g/dL   RDW 40.914.8 81.111.5 - 91.415.5 %   Platelets 288 150 - 400 K/uL   Neutrophils Relative % 92 %   Lymphocytes Relative 4 %   Monocytes Relative 4 %   Eosinophils Relative 0 %   Basophils Relative 0 %   Neutro Abs 20.2 (H) 1.7 - 7.7 K/uL   Lymphs Abs 0.9 0.7 - 4.0 K/uL   Monocytes Absolute 0.9 0.1 - 1.0 K/uL   Eosinophils Absolute 0.0 0.0 - 0.7 K/uL   Basophils Absolute 0.0 0.0 - 0.1 K/uL   WBC Morphology DOHLE BODIES   Glucose, capillary     Status: Abnormal   Collection Time: 09/05/2015 12:51 AM  Result Value Ref Range   Glucose-Capillary 203 (H) 65 - 99 mg/dL   Comment 1 Notify RN   Lactic acid, plasma     Status: Abnormal   Collection Time: 09/09/2015  2:59 AM  Result Value Ref Range   Lactic Acid, Venous 4.2 (HH) 0.5 - 1.9 mmol/L  Glucose, capillary     Status: Abnormal   Collection Time: 09/13/2015  4:36 AM  Result Value Ref Range   Glucose-Capillary 184 (H) 65 - 99 mg/dL   Comment 1 Notify RN   Troponin I     Status: Abnormal   Collection Time: 09/28/2015  5:21 AM  Result Value Ref Range   Troponin I 2.15 (HH) <0.03 ng/mL  CBC     Status: Abnormal   Collection Time: 10/01/2015  5:21 AM  Result Value Ref Range   WBC 14.4 (H) 4.0 - 10.5 K/uL   RBC 3.78 (L) 3.87 - 5.11 MIL/uL   Hemoglobin 10.9 (L) 12.0 - 15.0 g/dL   HCT 78.234.0 (L) 95.636.0 - 21.346.0 %   MCV 89.9 78.0 - 100.0 fL   MCH 28.8 26.0 - 34.0 pg   MCHC 32.1 30.0 - 36.0 g/dL   RDW 08.614.8 57.811.5 - 46.915.5 %   Platelets 254 150 - 400 K/uL  Basic metabolic panel     Status: Abnormal   Collection Time: 09/07/2015  5:21 AM  Result Value Ref Range   Sodium 134 (L) 135 - 145 mmol/L   Potassium 3.7 3.5 - 5.1 mmol/L   Chloride 103 101 - 111 mmol/L   CO2 17 (L) 22 - 32 mmol/L   Glucose, Bld 168 (H) 65 - 99 mg/dL   BUN 33 (H) 6 - 20 mg/dL   Creatinine, Ser 6.292.58 (H) 0.44 - 1.00 mg/dL   Calcium 8.0 (L) 8.9 - 10.3 mg/dL    GFR calc non Af Amer 19 (L) >60 mL/min   GFR calc Af Amer 21 (L) >60 mL/min   Anion gap 14 5 - 15  Magnesium     Status: Abnormal   Collection Time: 09/05/2015  5:21 AM  Result Value Ref Range   Magnesium 2.7 (H) 1.7 - 2.4 mg/dL  Phosphorus     Status: None   Collection Time: 09/30/2015  5:21 AM  Result Value Ref Range   Phosphorus 3.8 2.5 - 4.6 mg/dL  Glucose, capillary     Status: Abnormal   Collection Time: 09/12/2015  7:13 AM  Result Value Ref Range   Glucose-Capillary 168 (H) 65 - 99 mg/dL   Comment 1 Notify RN    Comment 2 Document in Chart   Troponin I     Status: Abnormal   Collection Time:  09/12/2015 10:27 AM  Result Value Ref Range   Troponin I 2.03 (HH) <0.03 ng/mL   Recent Results (from the past 240 hour(s))  MRSA PCR Screening     Status: None   Collection Time: 05-17-2015 10:24 PM  Result Value Ref Range Status   MRSA by PCR NEGATIVE NEGATIVE Final    Comment:        The GeneXpert MRSA Assay (FDA approved for NASAL specimens only), is one component of a comprehensive MRSA colonization surveillance program. It is not intended to diagnose MRSA infection nor to guide or monitor treatment for MRSA infections.     Renal Function:  Recent Labs  05-17-2015 2355 09/15/2015 0521  CREATININE 2.67* 2.58*   Estimated Creatinine Clearance: 24.3 mL/min (by C-G formula based on SCr of 2.58 mg/dL).  Radiologic Imaging: Dg Chest Port 1 View  Result Date: 09/26/2015 CLINICAL DATA:  Increasing shortness of breath EXAM: PORTABLE CHEST 1 VIEW COMPARISON:  02-Jul-2015 FINDINGS: Chronic cardiopericardial enlargement. Cephalized blood flow with interstitial prominence. There is no Kerley line, consolidation, effusion, or pneumothorax. IMPRESSION: Cardiomegaly and pulmonary venous congestion. Electronically Signed   By: Marnee SpringJonathon  Watts M.D.   On: 09/15/2015 00:30    I independently reviewed the above imaging studies.  Cultures pending.  She is on  Imipenem.  Impression/Recommendation: R UPJ stone with febrile UTI: She requires urgent decompression of her right kidney.  We discussed pros and cons of percutaneous nephrostomy vs ureteral stent placement.  She did receive a one time dose of ASA 325 mg this morning and so there is a slight increased risk of bleeding with nephrostomy placement.  However, she is also at a higher risk of complications with anesthesia due to her current hemodynamic status.  I have called Dr. Bonnielee HaffHoss in IR and will discuss pros and cons of each procedure.  If the risk of bleeding with nephrostomy placement is not excessive, I would favor that approach.  She will eventually need definitive treatment of her stone after resolution of her infection.  Continue broad spectrum antibiotics pending her culture results.  Justice Milliron,LES 09/19/2015, 11:37 AM  Moody BruinsLester S. Cowan Pilar Jr. MD   CC: Dr. Isaiah SergeMannam    I spoke with Dr. Bonnielee HaffHoss who believes the risk of bleeding with nephrostomy tube placement would be acceptable.  Plan is to proceed with nephrostomy placement today.

## 2015-10-03 NOTE — Progress Notes (Signed)
Rhythm changes on pts monitor, NP Hoffman made aware, EKG obtained.  Pt went into a bradycardic rhythm then into asystole.  ACLS performed until (see sheets) converting into sinus tachycardia. EKG obtained and pt made DNR status.  Large group of family brought into pts room for visitation.  Pt went asystole and pronounced at 2346 by Mindy, RN.  Port mortem care performed. Family at bedside

## 2015-10-03 NOTE — Progress Notes (Signed)
This RN arrived to IR to bring patient back to 2MW after her procedure. Upon my arrival I observed pt to be blue in the face, tachypneic, tachycardic and in respiratory distress. O2 sats in the 70's and non rebreather placed in order to get O2 sats back up to 98%. Due to patients shaking we were unable to get an accurate BP. Patient brought back to room and CCM called. Dr. Isaiah SergeMannam at the bedside. Lorin PicketLindsey Nilay Mangrum, RN

## 2015-10-03 NOTE — Progress Notes (Signed)
   11/09/2015 2300  Clinical Encounter Type  Visited With Family  Visit Type Code  Referral From Nurse  Stress Factors  Family Stress Factors Loss  Chaplain responded to report of code on 73M, with large group of family congregating in halls. Security was present, and they, along with nurses and doctors, were controlling the situation. The family's pastor was there, and the family asked if I would leave them with him. Jayci Ellefson, Chaplain

## 2015-10-03 NOTE — Progress Notes (Signed)
   09/13/2015 1800  Clinical Encounter Type  Visited With Patient and family together  Visit Type Spiritual support  Referral From Nurse  Spiritual Encounters  Spiritual Needs Emotional;Prayer  Stress Factors  Family Stress Factors Health changes;Lack of knowledge  Family upset by change in patient's status; husband seemed to be hyperventilating, so I stayed with him and offered support and prayer. Jaiah Weigel, Chaplain

## 2015-10-03 NOTE — Progress Notes (Signed)
CRITICAL VALUE ALERT  Critical value received:  Lactic 5.9  Date of notification:  09/12/2015  Time of notification:  1943  Critical value read back:Yes.    Nurse who received alert:  Judeth CornfieldStephanie, RN  MD notified (1st page):  Sommers   Time of first page:  1943  MD notified (2nd page):  Time of second page:  Responding MD:  Dellie CatholicSommers  Time MD responded:  [phone]

## 2015-10-03 NOTE — Progress Notes (Signed)
   Pt went into bradycardia then asystole. Pt pronounced dead at 11:46 pm. Family at bedside and aware. Pls provide post mortem care.    Pollie MeyerJ. Angelo A de Dios, MD 09/27/2015, 11:52 PM [facility] Pager [phone] After 3 pm or if no answer, call [phone]

## 2015-10-03 NOTE — Progress Notes (Signed)
   [facility]  I walked inside the room and saw pt moribund.  She was pale white and diaphoretic. BP was in 60sys, HR in 130s, RR in 20s. Sedated. Not in distress. (-) NVD. Very soft heart sounds. (-) s3/m/r/g. Good air entry. Hypoactive BS. Cool distal extremities, faint pulse.   EKG showed sinus tachcardia, ST elevation in inferolateral walls.  Pt then went into flatline/arrest.  We gave 2 amps of  NaHCO3 for metabolic acidosis with the ABG prior to arrests.  We started CPR. Went into Vfib x 1 during arrest and she got 120J shock. Subsequently revived after 9 mins.   I extensively d/w husband and daughters over all poor prognosis. Husband decided to make pt DNR.  I spoke to pt last night regarding code status and she had also implied "no heroic measures". \  Will continue present management. Cont Levophed drip at 20 mcg/kg/min. Cont neosynephrine drip at 200 mcg/kg/min. Will finish 1L bolus NS.  Cont HCO3 drip > will increase at 125 mls/hr.  Will hold off on heparin drip 2/2 pbleeding with nephrostomy tube.  ABG 6.9/54/89 on 100% Fio2 and PEEP of 15. Will adjust vent to increase her MV.    Critical care time spent with this pt today : 30 minutes.   Pollie MeyerJ. Angelo A de Dios, MD 12-16-2015, 11:30 PM [facility] Pager [phone] After 3 pm or if no answer, call [phone]

## 2015-10-03 NOTE — Progress Notes (Signed)
CRITICAL VALUE ALERT  Critical value received: Lactic acid 7.3 and Troponin 1.83  Date of notification: August 29, 2015  Time of notification:  0055  Critical value read back: yes  Nurse who received alert:  Julius BowelsFelicia Sharmayne Jablon, RN  MD notified (1st page):  Dr. Vassie LollAlva

## 2015-10-03 NOTE — Telephone Encounter (Signed)
On 10/03/2015 I received a death certificate from [employer] (original). The death certificate is for burial. The patient is a patient of Doctor Dios. The death certificate will be taken to [facility] Tuesday am (10/07/15) for signature.  On 10/07/2015 I received the death certificate back from Doctor Dios. I got the death certificate ready and called the funeral home to let them know the death certificate is ready for pickup.

## 2015-10-03 DEATH — deceased

## 2017-03-14 IMAGING — US IR NEPHROSTOMY PLACEMENT RIGHT
1 series · 1 of 1 positions shown · non-contrast
Comparison: None.

INDICATION: Right ureteral obstruction secondary to UPJ calculus

EXAM:
IR NEPHROSTOMY PLACEMENT RIGHT

[Series 1: ir (id) (id)/(id)/(id) ir · 1 of 1 slices shown]
[im 1/1]
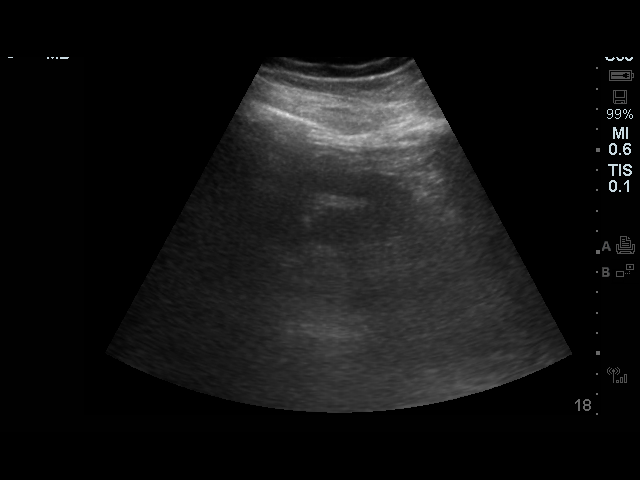

[1 of 1 positions shown; findings below may reference images not displayed]

MEDICATIONS:
None.

ANESTHESIA/SEDATION:
Fentanyl 150 mcg IV; Versed 3 mg IV

Moderate Sedation Time:  30

The patient was continuously monitored during the procedure by the
interventional radiology nurse under my direct supervision.

CONTRAST:  20mL FOJEL4-B11 IOPAMIDOL (FOJEL4-B11) INJECTION 61% -
administered into the collecting system(s)

FLUOROSCOPY TIME:  Fluoroscopy Time: 1 minutes 42 seconds (28 mGy).

COMPLICATIONS:
None immediate.

PROCEDURE:
Informed written consent was obtained from the patient after a
thorough discussion of the procedural risks, benefits and
alternatives. All questions were addressed. Maximal Sterile Barrier
Technique was utilized including caps, mask, sterile gowns, sterile
gloves, sterile drape, hand hygiene and skin antiseptic. A timeout
was performed prior to the initiation of the procedure.

1% lidocaine was utilized local anesthesia. Under sonographic
guidance, a 22 gauge Chiba needle was inserted into a posterior
interpolar calyx. Contrast and gas were injected.

The needle was removed over a 018 wire which was up sized to Kamara
Holtreman. Ten French dilator followed by a 10 French drain were
inserted. It was looped and string fixed in the renal pelvis.
Contrast was injected. Frank pus was aspirated.
FINDINGS: Images demonstrate access into the right kidney via posterior
interpolar calyx. Final image demonstrates placement of a right
nephrostomy catheter.
IMPRESSION: Successful right nephrostomy catheter placement
